# Patient Record
Sex: Female | Born: 1980 | Hispanic: Yes | Marital: Single | Smoking: Never smoker
Health system: Southern US, Community
[De-identification: ages and names within clinical notes are randomized; demographics above are authoritative.]

## PROBLEM LIST (undated history)

## (undated) ENCOUNTER — Inpatient Hospital Stay: Payer: Self-pay

## (undated) DIAGNOSIS — N7011 Chronic salpingitis: Secondary | ICD-10-CM

## (undated) DIAGNOSIS — D649 Anemia, unspecified: Secondary | ICD-10-CM

## (undated) HISTORY — DX: Chronic salpingitis: N70.11

---

## 2015-12-27 ENCOUNTER — Observation Stay
Admission: EM | Admit: 2015-12-27 | Discharge: 2015-12-28 | Disposition: A | Discharge status: Home / Self Care | Payer: Self-pay | Attending: Obstetrics and Gynecology | Admitting: Obstetrics and Gynecology

## 2015-12-27 ENCOUNTER — Encounter: Payer: Self-pay | Admitting: *Deleted

## 2015-12-27 DIAGNOSIS — O468X3 Other antepartum hemorrhage, third trimester: Secondary | ICD-10-CM | POA: Insufficient documentation

## 2015-12-27 DIAGNOSIS — Z3A29 29 weeks gestation of pregnancy: Secondary | ICD-10-CM | POA: Insufficient documentation

## 2015-12-27 NOTE — OB Triage Note (Signed)
Recvd pt from ED. C/o vaginal spotting/bleeding. Feeling baby move okay. Denies sexual intercourse in the past 24 hours. States she has no pain

## 2015-12-28 LAB — CHLAMYDIA/NGC RT PCR (ARMC ONLY)
Chlamydia Tr: NOT DETECTED
N gonorrhoeae: NOT DETECTED

## 2015-12-28 MED ORDER — BACITRACIN 500 UNIT/GM EX OINT
TOPICAL_OINTMENT | CUTANEOUS | Status: AC
  Administered 2015-12-28: 1 via TOPICAL
  Filled 2015-12-28: qty 0.9

## 2015-12-28 NOTE — Final Progress Note (Signed)
Physician Final Progress Note  Patient ID: Brittany Richards MRN: [id number] DOB/AGE: [birth year] 35 y.o.  Admit date: 12/27/2015 Admitting provider: Will Bonnet, MD Discharge date: 12/28/2015   Admission Diagnoses:  Vaginal bleeding  Discharge Diagnoses:  Vaginal laceration  History of Present Illness: The patient is a 35 y.o. female G2P1001 at [redacted]w[redacted]d who presents for new-onset vaginal bleeding. The bleeding started suddenly at 10pm last night.  No inciting factors. Nothing makes it better worse. She has tried no treatment. Denies smoking, drug use, abdominal trauma pelvic trauma.  Denies fevers, chills. Patient transferred her care to [facility] from [place] about two months ago.  She has had an ultrasound that showed a 24-week pregnancy.  Placenta is anterior with no reported previa (review of [facility] record where anatomy ultrasound was performed).   She is present with her husband a woman who claims to be a relative.   Past Medical History:  denies  Past Surgical History: Denies  Medications: None  Allergies: No Known Allergies  Social History: Denies smoking, EtOH use, drug use.  From [place].    Physical Exam: BP 133/80 (BP Location: Right Arm)   Pulse 100   Temp 98.1 F (36.7 C) (Oral)   Resp 16   LMP 06/04/2015   Gen: NAD CV: RRR Pulm: CTAB Abdomen: Gravid, non-tender, soft Pelvic: Approximately 3cm right labium majus laceration with active bleeding.. Edema involving the anterior introitus around the urethral meatus.  No other lesions noted. No active bleeding from cervix or found in the proximal vaginal. Cervical os is visually closed. The vaginal opening is cleared of blood. Pressure is held over the laceration and hemostasis obtained.  Bacitracin placed over laceration to protect from friction.  Ext: no e/c/t  Consults: None  Significant Findings/ Diagnostic Studies:  Gonorrhea/Chlamydia sent  Procedures:  NST  Baseline: 140 Variability:  moderate Accelerations: present Decelerations: absent Tocometry: no activity   Discharge Condition: stable  Disposition: Final discharge disposition not confirmed  Diet: Regular diet  Discharge Activity: Activity as tolerated, pelvic rest until at least her next appointment.  Total time spent taking care of this patient: 45 minutes  Signed: Will Bonnet, MD  12/28/2015, 1:00 AM

## 2015-12-28 NOTE — Discharge Instructions (Signed)
Wipe carefully/ pat dry Avoid tight clothing No sexual intercourse until after next OB appointment  If it starts to bleed, hold pressure at site.  Call OB with any questions or concerns!

## 2016-02-28 DIAGNOSIS — O133 Gestational [pregnancy-induced] hypertension without significant proteinuria, third trimester: Secondary | ICD-10-CM | POA: Insufficient documentation

## 2016-10-31 ENCOUNTER — Encounter (HOSPITAL_COMMUNITY): Payer: Self-pay

## 2018-06-05 DIAGNOSIS — O09522 Supervision of elderly multigravida, second trimester: Secondary | ICD-10-CM | POA: Insufficient documentation

## 2018-06-08 ENCOUNTER — Other Ambulatory Visit: Payer: Self-pay | Admitting: Family Medicine

## 2018-06-08 ENCOUNTER — Ambulatory Visit
Admission: RE | Admit: 2018-06-08 | Discharge: 2018-06-08 | Disposition: A | Discharge status: Home / Self Care | Payer: PRIVATE HEALTH INSURANCE | Source: Ambulatory Visit | Attending: Family Medicine | Admitting: Family Medicine

## 2018-06-08 ENCOUNTER — Ambulatory Visit
Admission: RE | Admit: 2018-06-08 | Discharge: 2018-06-08 | Disposition: A | Discharge status: Home / Self Care | Payer: PRIVATE HEALTH INSURANCE | Attending: Family Medicine | Admitting: Family Medicine

## 2018-06-08 DIAGNOSIS — R7611 Nonspecific reaction to tuberculin skin test without active tuberculosis: Secondary | ICD-10-CM | POA: Insufficient documentation

## 2019-01-17 ENCOUNTER — Ambulatory Visit: Payer: Self-pay | Admitting: Advanced Practice Midwife

## 2019-01-17 ENCOUNTER — Other Ambulatory Visit: Payer: Self-pay

## 2019-01-17 DIAGNOSIS — Z3009 Encounter for other general counseling and advice on contraception: Secondary | ICD-10-CM

## 2019-01-17 LAB — PREGNANCY, URINE: Preg Test, Ur: NEGATIVE

## 2019-01-17 LAB — HEMOGLOBIN, FINGERSTICK: Hemoglobin: 13.3 g/dL (ref 11.1–15.9)

## 2019-01-17 NOTE — Progress Notes (Signed)
In for post partum visit; unsure of [facility] Debera Lat, RN

## 2019-01-17 NOTE — Progress Notes (Signed)
Post Partum Exam  Brittany Richards is a 38 y.o. [phone] female who presents for a postpartum visit. She is 6 wks postpartum following a spontaneous vaginal delivery on 11/16/2018 for female infant. I have fully reviewed the prenatal and intrapartum course. The delivery was at 38.0 gestational weeks.  Anesthesia: epidural. Postpartum course has been normal. Baby's course has been good. Baby is feeding by breast q 2 hrs.. Bleeding no bleeding. Bowel function is normal. Bladder function is normal. Patient is sexually active. Coitus x2 (01/05/19 and 01/14/19 with condoms) Contraception method is condoms. Pt does not want another pregnancy  Postpartum depression screening: Edinburgh Postnatal Depression Scale - 01/17/19 1229      Edinburgh Postnatal Depression Scale:  In the Past 7 Days   I have been able to laugh and see the funny side of things.  0    I have looked forward with enjoyment to things.  0    I have blamed myself unnecessarily when things went wrong.  0    I have been anxious or worried for no good reason.  0    I have felt scared or panicky for no good reason.  0    Things have been getting on top of me.  0    I have been so unhappy that I have had difficulty sleeping.  0    I have felt sad or miserable.  0    I have been so unhappy that I have been crying.  0    The thought of harming myself has occurred to me.  0    Edinburgh Postnatal Depression Scale Total  0         Last pap smear  up to date  Review of Systems neg   Objective:  BP 134/86   Ht 5' 2.5" (1.588 m)   Wt 152 lb 9.6 oz (69.2 kg)   Breastfeeding Yes Comment: No menses since delivery  BMI 27.47 kg/m   General:  alert   Breasts:  inspection negative, no nipple discharge or bleeding, no masses or nodularity palpable  Lungs: clear to auscultation bilaterally  Heart:  regular rate and rhythm, S1, S2 normal, no murmur, click, rub or gallop  Abdomen: soft, non-tender; bowel sounds normal; no masses,  no organomegaly    Vulva:  normal  Vagina: normal vagina  Cervix:  wnl  Corpus: normal size, contour, position, consistency, mobility, non-tender  Adnexa:  normal adnexa  Rectal Exam: Not performed.        Assessment:     postpartum exam. Pap smear up to date  Plan:   1. Contraception: condoms but desires Mirena 2. Infant feeding:  patient is currently feeding with breastmilk.   3. Mood: EPDS is low risk. Reviewed resources and that mood sx in first year after pregnancy are considered related to pregnancy and to reach out for help at [facility] if needed. Discussed [facility] as link to care and availability of LCSW for counseling    Patient given handout about PCP care in the community Given MVI per family planning program  Follow up in: 1 wk for Mirena insertion or as needed.

## 2019-01-23 ENCOUNTER — Ambulatory Visit (LOCAL_COMMUNITY_HEALTH_CENTER): Payer: Self-pay | Admitting: Family Medicine

## 2019-01-23 ENCOUNTER — Other Ambulatory Visit: Payer: Self-pay

## 2019-01-23 VITALS — BP 132/90 | Wt 150.2 lb

## 2019-01-23 DIAGNOSIS — Z30430 Encounter for insertion of intrauterine contraceptive device: Secondary | ICD-10-CM

## 2019-01-23 DIAGNOSIS — Z113 Encounter for screening for infections with a predominantly sexual mode of transmission: Secondary | ICD-10-CM

## 2019-01-23 DIAGNOSIS — Z3009 Encounter for other general counseling and advice on contraception: Secondary | ICD-10-CM

## 2019-01-23 MED ORDER — LEVONORGESTREL 20 MCG/24HR IU IUD
1.0000 | INTRAUTERINE_SYSTEM | Freq: Once | INTRAUTERINE | Status: AC
  Administered 2019-01-23: 1 via INTRAUTERINE

## 2019-01-23 NOTE — Progress Notes (Signed)
    Patient presented to [facility] for IUD insertion. Her GC/CT screening was found to be up to date and using WHO criteria we can be reasonably certain she is not pregnant.  See Flowsheet for IUD check list  IUD Insertion Procedure Note Patient identified, informed consent performed, consent signed.   Discussed risks of irregular bleeding, cramping, infection, malpositioning or misplacement of the IUD outside the uterus which may require further procedure such as laparoscopy. Time out was performed.    Speculum placed in the vagina.  Cervix visualized.  Cleaned with Betadine x 2.  Grasped anteriorly with a single tooth tenaculum.  Uterus sounded to 11 cm.  IUD placed per manufacturer's recommendations.  Strings trimmed to 3 cm.  Tenaculum was removed, good hemostasis noted.  Patient tolerated procedure well.   Patient was given post-procedure instructions.  She was advised to have backup contraception for one week.  Patient was also asked to check IUD strings periodically or follow up in 4 weeks for IUD check.                                                    Co to take Ibuprofen 600-800 mg q 8 hrs for cramping prn.

## 2019-01-23 NOTE — Progress Notes (Signed)
Patient here for Mirena IUD insertion.Jenetta Downer, RN

## 2019-02-13 NOTE — Addendum Note (Signed)
Addended by: Caryl Bis on: 02/13/2019 02:58 PM   Modules accepted: Orders

## 2019-02-14 NOTE — Addendum Note (Signed)
Addended by: Hassell Done on: 02/14/2019 08:17 AM   Modules accepted: Orders

## 2019-02-22 NOTE — Addendum Note (Signed)
Addended by: Caryl Bis on: 02/22/2019 04:41 PM   Modules accepted: Orders

## 2019-02-27 NOTE — Addendum Note (Signed)
Addended by: Cletis Media on: 02/27/2019 02:31 PM   Modules accepted: Orders

## 2019-03-19 ENCOUNTER — Encounter: Payer: Self-pay | Admitting: Physician Assistant

## 2019-03-19 ENCOUNTER — Ambulatory Visit (LOCAL_COMMUNITY_HEALTH_CENTER): Payer: Self-pay | Admitting: Physician Assistant

## 2019-03-19 ENCOUNTER — Other Ambulatory Visit: Payer: Self-pay

## 2019-03-19 VITALS — BP 134/80 | Ht 63.0 in | Wt 149.0 lb

## 2019-03-19 DIAGNOSIS — Z3009 Encounter for other general counseling and advice on contraception: Secondary | ICD-10-CM

## 2019-03-19 DIAGNOSIS — Z30431 Encounter for routine checking of intrauterine contraceptive device: Secondary | ICD-10-CM

## 2019-03-19 NOTE — Progress Notes (Signed)
   [place] problem visit  [place] Department  Subjective:  Yarieliz Patzner is a 38 y.o. being seen today for irregular bleeding with the Mirena.  Chief Complaint  Patient presents with  . Contraception    HPI  Patient here due to bleeding since IUD placed on 01/23/2019.  Per chart, patient had PP exam 01/17/2019 and Mirena insertion on 01/23/2019.  States that she is still breastfeeding.  Reports that the bleeding has mostly been like a normal period as far as flow but seems to be slowing a little the last day or two.  Denies any vaginal symptoms and declines pelvic exam today.   Does the patient have a current or past history of drug use? No   No components found for: HCV]   Health Maintenance Due  Topic Date Due  . HIV Screening  10/26/1993  . PAP SMEAR-Modifier  10/27/1999  . INFLUENZA VACCINE  12/23/2018    Review of Systems  All other systems reviewed and are negative.   The following portions of the patient's history were reviewed and updated as appropriate: allergies, current medications, past family history, past medical history, past social history, past surgical history and problem list. Problem list updated.   See flowsheet for other program required questions.  Objective:   Vitals:   03/19/19 1033  BP: 134/80  Weight: 149 lb (67.6 kg)  Height: 5\' 3"  (1.6 m)    Physical Exam Vitals signs reviewed.  Constitutional:      General: She is not in acute distress.    Appearance: Normal appearance.  HENT:     Head: Normocephalic and atraumatic.  Pulmonary:     Effort: Pulmonary effort is normal.  Neurological:     Mental Status: She is alert and oriented to person, place, and time.  Psychiatric:        Mood and Affect: Mood normal.        Behavior: Behavior normal.        Thought Content: Thought content normal.        Judgment: Judgment normal.       Assessment and Plan:  Leonila Emmi is a 38 y.o. female presenting to  the [facility] Department for a Women's Health problem visit  Patient with current LARC device complaining of irregular bleeding. Has a Mirena. Counseled on options which include watchful waiting, scheduled NSAIDs, OCP for limited time, removal. Counseled on the the normality of bleeding with method and typical bleeding patterns with LARC method.   1. Encounter for counseling regarding contraception Reassured patient that she is having a normal SE of the Mirena and due to breastfeeding would not recommend hormonal option as first line for her. Reassured that if bleeding is slowing down it may stop on its own, but patient would like to "do" something other than wait to see if this happens. Counseled to use OTC IB 800 mg q 8 hr with food or milk for 5 days in a row and call back if bleeding gets heavier or resumes.  2. Surveillance of previously prescribed intrauterine contraceptive device Patient with MIrena and desires to continue with this as BCM. Declines pelvic today due to bleeding.     No follow-ups on file.  No future appointments.  Jerene Dilling, PA

## 2019-03-19 NOTE — Progress Notes (Signed)
In due to c/o bleeding since IUD insertion 01/23/19; reports reddish and now turning brownish;  Satisfied with IUD otherwise Debera Lat, RN

## 2019-04-06 ENCOUNTER — Telehealth: Payer: Self-pay | Admitting: Family Medicine

## 2019-04-06 NOTE — Telephone Encounter (Signed)
TC to patient who states she has had continual bleeding since Mirena insertion on 01/23/2019. Patient came to clinic on 03/19/2019 due to bleeding, and states she tried the recommended ibuprofen 3 times per day for 5 days and that it did not help. Patient counseled that replacing the IUD with a nexplanon would not likely solve her problem because Nexplanon has same type hormone as Mirena and also has side effect of irregular bleeding for 3-6 months after insertion. After consult with provider, C. Lazarus Salines, patient was called again with provider recommendation that she keep her 04/10/2019 appointment. Patient counseled that provider recommends trying a couple of months of OC with IUD to get bleeding under control, rather than switching to Nexplanon at this time. Patient states understanding and agrees to plan. Interpreter, Dot Lanes.Jenetta Downer, RN

## 2019-04-06 NOTE — Telephone Encounter (Signed)
Consulted by RN re:  Patient complaint of irregular bleeding with IUD.  Reviewed documentation and agree with advice given as documented.

## 2019-04-10 ENCOUNTER — Other Ambulatory Visit: Payer: Self-pay

## 2019-04-10 ENCOUNTER — Encounter: Payer: Self-pay | Admitting: Family Medicine

## 2019-04-10 ENCOUNTER — Ambulatory Visit (LOCAL_COMMUNITY_HEALTH_CENTER): Payer: Self-pay | Admitting: Family Medicine

## 2019-04-10 VITALS — BP 127/82 | Ht 62.0 in | Wt 147.0 lb

## 2019-04-10 DIAGNOSIS — Z975 Presence of (intrauterine) contraceptive device: Secondary | ICD-10-CM

## 2019-04-10 DIAGNOSIS — Z3009 Encounter for other general counseling and advice on contraception: Secondary | ICD-10-CM

## 2019-04-10 DIAGNOSIS — N921 Excessive and frequent menstruation with irregular cycle: Secondary | ICD-10-CM

## 2019-04-10 DIAGNOSIS — Z30011 Encounter for initial prescription of contraceptive pills: Secondary | ICD-10-CM

## 2019-04-10 MED ORDER — NORGESTIMATE-ETH ESTRADIOL 0.25-35 MG-MCG PO TABS: 1.0000 | ORAL_TABLET | Freq: Every day | ORAL | 11 refills | Status: DC

## 2019-04-10 MED ORDER — NORGESTIMATE-ETH ESTRADIOL 0.25-35 MG-MCG PO TABS: 1.0000 | ORAL_TABLET | Freq: Every day | ORAL | 0 refills | Status: DC

## 2019-04-10 NOTE — Progress Notes (Signed)
   [place] problem visit  [place] Department  Subjective:  Brittany Richards is a 38 y.o. being seen today for   Chief Complaint  Patient presents with  . Contraception  . Menstrual Problem    HPI  Client states that she has had irreg bleeding with Mirena since insertion 01/2019.  Mirena was placed ~ 6 wks pp @ [facility].  Client states she was instructed to take ibuprofen 600 mg TID.  States she took med 5 days and felt it wasn't helping her bleeding.  She states she had an IUD in the past for about 1 year and had removed d/t irreg. Bleeding.  She feels that a switch to the implant will help her bleeding.  Denies abdominal pain, vaginal disch,pain with sex,or fever.   Does the patient have a current or past history of drug use? No   No components found for: HCV]   Health Maintenance Due  Topic Date Due  . HIV Screening  10/26/1993  . PAP SMEAR-Modifier  10/27/1999  . INFLUENZA VACCINE  12/23/2018    ROS  The following portions of the patient's history were reviewed and updated as appropriate: allergies, current medications, past family history, past medical history, past social history, past surgical history and problem list. Problem list updated.   See flowsheet for other program required questions.  Objective:   Vitals:   04/10/19 1053  BP: 127/82  Weight: 147 lb (66.7 kg)  Height: 5\' 2"  (1.575 m)    Physical Exam  not indicated    Assessment and Plan:  Brittany Richards is a 38 y.o. female presenting to the [facility] Department for a Women's Health problem visit  Patient with current LARC device complaining of irregular bleeding. Has a Mirena. Counseled on options which include watchful waiting, scheduled NSAIDs, OCP for limited time, removal. Counseled on the the normality of bleeding with method and typical bleeding patterns with LARC method.   1. General counseling and advice on contraceptive management  2. Breakthrough  bleeding associated with intrauterine device (IUD) Co that irregular bleeding is a normal SE of the IUD.  Co that changing to another Progesterone method (Nexplanon) may have the same SE for client.  Advised to manage the bleeding with the IUD instead of changing a method.  Client agrees with the plan. - norgestimate-ethinyl estradiol (ORTHO-CYCLEN) 0.25-35 MG-MCG tablet; Take 1 tablet by mouth daily.  Dispense: 2 Package; Refill: 0 Co. To take 6 weeks of active pills.  Co. To contact clinic if bleeding hasn't resolved.  3. OCP (oral contraceptive pills) initiatio - norgestimate-ethinyl estradiol (ORTHO-CYCLEN) 0.25-35 MG-MCG tablet; Take 1 tablet by mouth daily.  Dispense: 2 Package; Refill: 0     No follow-ups on file.  No future appointments.  Hassell Done, FNP

## 2019-04-10 NOTE — Progress Notes (Signed)
Patient here to have IUD removed and change birth control methods. See provider note. Hal Morales, RN

## 2019-10-13 ENCOUNTER — Emergency Department
Admission: EM | Admit: 2019-10-13 | Discharge: 2019-10-13 | Disposition: A | Discharge status: Home / Self Care | Payer: Self-pay | Attending: Emergency Medicine | Admitting: Emergency Medicine

## 2019-10-13 ENCOUNTER — Other Ambulatory Visit: Payer: Self-pay

## 2019-10-13 ENCOUNTER — Encounter: Payer: Self-pay | Admitting: Emergency Medicine

## 2019-10-13 ENCOUNTER — Emergency Department: Payer: Self-pay

## 2019-10-13 DIAGNOSIS — M5432 Sciatica, left side: Secondary | ICD-10-CM | POA: Insufficient documentation

## 2019-10-13 DIAGNOSIS — R1900 Intra-abdominal and pelvic swelling, mass and lump, unspecified site: Secondary | ICD-10-CM

## 2019-10-13 DIAGNOSIS — R1909 Other intra-abdominal and pelvic swelling, mass and lump: Secondary | ICD-10-CM | POA: Insufficient documentation

## 2019-10-13 LAB — COMPREHENSIVE METABOLIC PANEL
ALT: 15 U/L (ref 0–44)
AST: 17 U/L (ref 15–41)
Albumin: 4.8 g/dL (ref 3.5–5.0)
Alkaline Phosphatase: 90 U/L (ref 38–126)
Anion gap: 11 (ref 5–15)
BUN: 12 mg/dL (ref 6–20)
CO2: 22 mmol/L (ref 22–32)
Calcium: 9 mg/dL (ref 8.9–10.3)
Chloride: 103 mmol/L (ref 98–111)
Creatinine, Ser: 0.44 mg/dL (ref 0.44–1.00)
GFR calc Af Amer: >60 mL/min (ref >60)
GFR calc non Af Amer: >60 mL/min (ref >60)
Glucose, Bld: 92 mg/dL (ref 70–99)
Potassium: 3.5 mmol/L (ref 3.5–5.1)
Sodium: 136 mmol/L (ref 135–145)
Total Bilirubin: 0.7 mg/dL (ref 0.3–1.2)
Total Protein: 8.7 g/dL — ABNORMAL HIGH (ref 6.5–8.1)

## 2019-10-13 LAB — URINALYSIS, COMPLETE (UACMP) WITH MICROSCOPIC
Bacteria, UA: NONE SEEN
Bilirubin Urine: NEGATIVE
Glucose, UA: NEGATIVE mg/dL
Hgb urine dipstick: NEGATIVE
Ketones, ur: NEGATIVE mg/dL
Leukocytes,Ua: NEGATIVE
Nitrite: NEGATIVE
Protein, ur: NEGATIVE mg/dL
Specific Gravity, Urine: 1.008 (ref 1.005–1.030)
Squamous Epithelial / LPF: NONE SEEN (ref 0–5)
pH: 5 (ref 5.0–8.0)

## 2019-10-13 LAB — CBC WITH DIFFERENTIAL/PLATELET
Abs Immature Granulocytes: 0.03 10*3/uL (ref 0.00–0.07)
Basophils Absolute: 0 10*3/uL (ref 0.0–0.1)
Basophils Relative: 0 %
Eosinophils Absolute: 0.1 10*3/uL (ref 0.0–0.5)
Eosinophils Relative: 1 %
HCT: 37.2 % (ref 36.0–46.0)
Hemoglobin: 12.8 g/dL (ref 12.0–15.0)
Immature Granulocytes: 0 %
Lymphocytes Relative: 25 %
Lymphs Abs: 2.6 10*3/uL (ref 0.7–4.0)
MCH: 31 pg (ref 26.0–34.0)
MCHC: 34.4 g/dL (ref 30.0–36.0)
MCV: 90.1 fL (ref 80.0–100.0)
Monocytes Absolute: 0.8 10*3/uL (ref 0.1–1.0)
Monocytes Relative: 7 %
Neutro Abs: 6.9 10*3/uL (ref 1.7–7.7)
Neutrophils Relative %: 67 %
Platelets: 360 10*3/uL (ref 150–400)
RBC: 4.13 MIL/uL (ref 3.87–5.11)
RDW: 11.9 % (ref 11.5–15.5)
WBC: 10.4 10*3/uL (ref 4.0–10.5)
nRBC: 0 % (ref 0.0–0.2)

## 2019-10-13 LAB — POCT PREGNANCY, URINE: Preg Test, Ur: NEGATIVE

## 2019-10-13 MED ORDER — PREDNISONE 10 MG PO TABS
ORAL_TABLET | ORAL | 0 refills | Status: DC
Start: 2019-10-13 — End: 2019-10-24

## 2019-10-13 MED ORDER — IOHEXOL 300 MG/ML  SOLN
100.0000 mL | Freq: Once | INTRAMUSCULAR | Status: AC | PRN
  Administered 2019-10-13: 100 mL via INTRAVENOUS
  Filled 2019-10-13: qty 100

## 2019-10-13 NOTE — ED Triage Notes (Signed)
Pt checked in at first nurse desk.  Verbal permission given from patient to ask questions at first nurse desk for triage.    Pt c/o lower left back pain for 2 weeks. No known injury.  No abdominal pain.  When sitting pain worse. Also worse with movement. Only time no pain is when standing still.  Goes down left leg.

## 2019-10-13 NOTE — ED Triage Notes (Signed)
First nurse note- lower back pain. No fall.

## 2019-10-13 NOTE — ED Provider Notes (Signed)
[facility] Emergency Department Provider Note  ____________________________________________   First MD Initiated Contact with Patient 10/13/19 1415     (approximate)  I have reviewed the triage vital signs and the nursing notes.   HISTORY  Chief Complaint Back Pain   HPI Brittany Richards is a 39 y.o. female presents to the ED with complaint of low back pain with radiation down her left leg that started approximately 2 weeks ago.  Patient also complains of some left sided abdominal pain.  Patient reports that she has had a "lump" that she can feel in her abdomen since her child was born 58 months ago.  Patient is currently breast-feeding her 24-month-old child.      History reviewed. No pertinent past medical history.  There are no problems to display for this patient.   History reviewed. No pertinent surgical history.  Prior to Admission medications   Medication Sig Start Date End Date Taking? Authorizing Provider  norgestimate-ethinyl estradiol (ORTHO-CYCLEN) 0.25-35 MG-MCG tablet Take 1 tablet by mouth daily. 04/10/19   Hassell Done, FNP  predniSONE (DELTASONE) 10 MG tablet Take 3 tablets once a day for 4 days 10/13/19   Johnn Hai, PA-C    Allergies Patient has no known allergies.  History reviewed. No pertinent family history.  Social History Social History   Tobacco Use  . Smoking status: Never Smoker  . Smokeless tobacco: Never Used  Substance Use Topics  . Alcohol use: No  . Drug use: No    Review of Systems Constitutional: No fever/chills Eyes: No visual changes. ENT: No sore throat. Cardiovascular: Denies chest pain. Respiratory: Denies shortness of breath. Gastrointestinal: Left anterior abdominal pain.  No nausea, no vomiting.  No diarrhea.  Genitourinary: Negative for dysuria. Musculoskeletal: Positive for pain left hip and upper thigh. Skin: Negative for rash. Neurological: Negative for headaches, focal weakness  or numbness. ___________________________________________   PHYSICAL EXAM:  VITAL SIGNS: ED Triage Vitals  Enc Vitals Group     BP 10/13/19 1358 (!) 154/85     Pulse Rate 10/13/19 1358 (!) 106     Resp 10/13/19 1358 16     Temp 10/13/19 1358 98.3 F (36.8 C)     Temp Source 10/13/19 1358 Oral     SpO2 10/13/19 1358 98 %     Weight 10/13/19 1356 136 lb (61.7 kg)     Height --      Head Circumference --      Peak Flow --      Pain Score 10/13/19 1355 6     Pain Loc --      Pain Edu? --      Excl. in [place]? --     Constitutional: Alert and oriented. Well appearing and in no acute distress. Eyes: Conjunctivae are normal.  Head: Atraumatic. Neck: No stridor.   Cardiovascular: Normal rate, regular rhythm. Grossly normal heart sounds.  Good peripheral circulation. Respiratory: Normal respiratory effort.  No retractions. Lungs CTAB. Gastrointestinal: Soft and nontender. No distention.  Left anterior mid abdomen there is a oval-shaped mass that is mobile.  Nontender to palpation.  No skin discoloration is noted in this area. Musculoskeletal: Moves upper and lower extremities with out any difficulty.  Lumbar spine is negative for any acute bony tenderness and no step-offs were appreciated.  There is marked tenderness on palpation of the left SI joint which is not present on the right side.  Patient in this area reproduces patient's pain.  Range of motion  is unrestricted.  Good muscle strength bilaterally.  Patient is able to stand without any assistance.  Normal gait was noted.. Neurologic:  Normal speech and language. No gross focal neurologic deficits are appreciated.  Reflexes are equal bilaterally at 2+.  No gait instability. Skin:  Skin is warm, dry and intact. No rash noted. Psychiatric: Mood and affect are normal. Speech and behavior are normal.  ____________________________________________   LABS (all labs ordered are listed, but only abnormal results are displayed)  Labs  Reviewed  URINALYSIS, COMPLETE (UACMP) WITH MICROSCOPIC - Abnormal; Notable for the following components:      Result Value   Color, Urine STRAW (*)    APPearance CLEAR (*)    All other components within normal limits  COMPREHENSIVE METABOLIC PANEL - Abnormal; Notable for the following components:   Total Protein 8.7 (*)    All other components within normal limits  CBC WITH DIFFERENTIAL/PLATELET  POC URINE PREG, ED  POCT PREGNANCY, URINE    RADIOLOGY    Official radiology report(s): CT ABDOMEN W CONTRAST  Result Date: 10/13/2019 CLINICAL DATA:  Abdominal wall mass, palpable abnormality, seen by ultrasound EXAM: CT ABDOMEN WITH CONTRAST TECHNIQUE: Multidetector CT imaging of the abdomen was performed using the standard protocol following bolus administration of intravenous contrast. CONTRAST:  196mL OMNIPAQUE IOHEXOL 300 MG/ML  SOLN COMPARISON:  Same-day ultrasound FINDINGS: Lower chest: No acute abnormality. Hepatobiliary: No solid liver abnormality is seen. Small gallstone in the dependent gallbladder. Gallbladder wall thickening, or biliary dilatation. Pancreas: Unremarkable. No pancreatic ductal dilatation or surrounding inflammatory changes. Spleen: Normal in size without significant abnormality. Adrenals/Urinary Tract: Adrenal glands are unremarkable. Kidneys are normal, without renal calculi, solid lesion, or hydronephrosis. Bladder is unremarkable. Stomach/Bowel: Stomach is within normal limits. Appendix appears normal. No evidence of bowel wall thickening, distention, or inflammatory changes. Vascular/Lymphatic: No significant vascular findings are present. No enlarged abdominal lymph nodes. Other: No abdominal wall hernia or abnormality. No abdominopelvic ascites. Musculoskeletal: There is an ill-defined, oval soft tissue mass in the left external oblique musculature measuring approximately 3.0 x 2.7 x 1.4 cm, in keeping with palpable abnormality and previously visualized by  ultrasound. IMPRESSION: 1. There is an ill-defined, oval soft tissue mass in the left external oblique musculature measuring approximately 3.0 x 2.7 x 1.4 cm, in keeping with palpable abnormality and previously visualized by ultrasound. This is nonspecific but concerning for soft tissue malignancy. Differential considerations do include benign sequelae of trauma. Nonemergent MRI may be helpful to further characterize, particularly for solid character and contrast enhancement. Consider tissue sampling and surgical referral. 2. Cholelithiasis. Electronically Signed   By: Eddie Candle M.D.   On: 10/13/2019 18:34   US Abdomen Limited  Result Date: 10/13/2019 CLINICAL DATA:  Palpable mass left mid abdomen EXAM: ULTRASOUND ABDOMEN LIMITED COMPARISON:  None. FINDINGS: Sonographic evaluation of the palpable area in the left mid abdominal wall was performed. Deep to the subcutaneous fat, there is a heterogeneous hypoechoic circumscribed 2.8 x 1.3 x 2.8 cm solid mass. This may involve the ventral aspect of the abdominal wall musculature. There is minimal internal vascularity. No evidence of underlying hernia. IMPRESSION: 1. Indeterminate circumscribed solid mass within the left mid abdominal wall corresponding to the palpable abnormality. Etiology, but differential diagnosis is nonspecific based on ultrasound characteristics could include intramuscular hematoma, intramuscular lipoma, or other soft tissue mass. CT of the abdomen with IV contrast may allow for better characterization. Electronically Signed   By: Randa Ngo M.D.   On: 10/13/2019 16:54  ____________________________________________   PROCEDURES  Procedure(s) performed (including Critical Care):  Procedures   ____________________________________________   INITIAL IMPRESSION / ASSESSMENT AND PLAN / ED COURSE  As part of my medical decision making, I reviewed the following data within the electronic MEDICAL RECORD NUMBER Notes from prior ED  visits and [place] Controlled Substance Database  39 year old female presents to the ED with complaint of left leg pain/numbness radiating from her left hip.  No history of injury.  She also is worried about a "lump" in her abdomen is been there for approximately 11 months.  Patient states she noticed it after giving birth to her child.  On physical exam her left leg paresthesias are most likely related to left sciatica and can be reproduced on palpation of the left SI joint area.  Neurologically she remains intact.  There is a oval-shaped, nontender, mobile mass in the left lateral portion of the abdomen.  Area is seen on both ultrasound and CT and has not been identified.  Differential diagnosis per radiologist includes possible soft tissue malignancy versus soft tissue trauma with sequela.  In talking with patient she does not have a PCP.  A referral is being made to the surgeon who is on-call today which is Dr. Dahlia Byes.  Patient and husband are aware that they need to call and make an appointment for further evaluation of this area.  Because she is breast-feeding patient was given a low dose of prednisone daily for the next 4 days.  We did discuss not using narcotics due to breast-feeding and patient understands.     ____________________________________________   FINAL CLINICAL IMPRESSION(S) / ED DIAGNOSES  Final diagnoses:  Abdominal mass of other site  Sciatica of left side     ED Discharge Orders         Ordered    predniSONE (DELTASONE) 10 MG tablet     10/13/19 1900           Note:  This document was prepared using Dragon voice recognition software and may include unintentional dictation errors.    Johnn Hai, PA-C 10/13/19 1913    Carrie Mew, MD 10/14/19 2211

## 2019-10-13 NOTE — ED Notes (Signed)
See triage note  Presents with lower back pain  Pain is mainly on left and goes into left leg

## 2019-10-13 NOTE — Discharge Instructions (Addendum)
You need to call and make an appointment with Dr. Dahlia Byes, who is the surgeon on call for further evaluation of the area that you are able to feel in your abdomen. It may be necessary to have surgery to remove this. For your sciatica a prescription for prednisone was sent to your pharmacy as needed for pain.

## 2019-10-15 ENCOUNTER — Telehealth: Payer: Self-pay | Admitting: Emergency Medicine

## 2019-10-15 NOTE — Telephone Encounter (Signed)
Pt was seen in ED 5/22, has abdominal mass. Per Dr Dahlia Byes pt needs to get schedule 1-2 weeks for follow up.  Called pt to make appt, no answer. Unable to leave vm due to vm not being set up. Pt does not have another contact number to call. Will call again at a later time.

## 2019-10-15 NOTE — Telephone Encounter (Signed)
Pt called back to the office. Appt made for 6/2 at 10:45am. Pt voiced understanding.

## 2019-10-24 ENCOUNTER — Ambulatory Visit (INDEPENDENT_AMBULATORY_CARE_PROVIDER_SITE_OTHER): Payer: Self-pay | Admitting: Surgery

## 2019-10-24 ENCOUNTER — Other Ambulatory Visit: Payer: Self-pay

## 2019-10-24 ENCOUNTER — Encounter: Payer: Self-pay | Admitting: Surgery

## 2019-10-24 VITALS — BP 152/93 | HR 118 | Temp 97.7°F | Resp 12 | Wt 136.4 lb

## 2019-10-24 DIAGNOSIS — R1902 Left upper quadrant abdominal swelling, mass and lump: Secondary | ICD-10-CM

## 2019-10-24 NOTE — Patient Instructions (Addendum)
MRI Abdomen is scheduled for 11/06/19 at [place] at the [facility]. Arrival time 8:30am. Nothing to eat or drink 4 hours prior.   Please see you appointment below. Contact the office if you have any questions or concerns.   La resonancia magntica del abdomen est programada para el 15/6/21 a las 9 am en [facility]. Hora de llegada 8:30 am. Hollice Gong para comer o beber 4 horas antes.  Por favor, vea su cita a continuacin. Comunquese con la oficina si tiene alguna pregunta o inquietud.

## 2019-10-25 ENCOUNTER — Encounter: Payer: Self-pay | Admitting: Surgery

## 2019-10-25 NOTE — Progress Notes (Signed)
Patient ID: Brittany Richards, female   DOB: Dec 16, 1980, 39 y.o.   MRN: [id number]  HPI Brittany Richards is a 39 y.o. female seen in consultation at the request of Ms. Summers PAC.  She was evaluated last week for a lump to the abdominal wall.  She reports that she has this for those to a year and noticed while she was pregnant.  She reports that there is no really significant change in size.  She denies any abdominal pain she denies any fevers any chills any B type symptoms.  She also denies any trauma.  She has had an ultrasound and CT scan that have personally reviewed showing evidence of a solid lesion within the left abdominal wall.  Concerning for malignancy.  Mass measures 3 x 3 cm. She denies any previous abdominal operations.  She is able to perform more than 4 METS of activity without any shortness of breath or chest pain. CBC and CMP were completely normal  HPI  History reviewed. No pertinent past medical history.  History reviewed. No pertinent surgical history.  History reviewed. No pertinent family history.  Social History Social History   Tobacco Use  . Smoking status: Never Smoker  . Smokeless tobacco: Never Used  Substance Use Topics  . Alcohol use: No  . Drug use: No    No Known Allergies  No current outpatient medications on file.   No current facility-administered medications for this visit.     Review of Systems Full ROS  was asked and was negative except for the information on the HPI  Physical Exam Blood pressure (!) 152/93, pulse (!) 118, temperature 97.7 F (36.5 C), resp. rate 12, weight 136 lb 6.4 oz (61.9 kg), SpO2 98 %, currently breastfeeding. CONSTITUTIONAL: NAD EYES: Pupils are equal, round, and reactive , Sclera are non-icteric. EARS, NOSE, MOUTH AND THROAT: She is wearing a mask. Hearing is intact to voice. LYMPH NODES:  Lymph nodes in the neck are normal. RESPIRATORY:  Lungs are clear. There is normal respiratory effort, with equal breath sounds  bilaterally, and without pathologic use of accessory muscles. CARDIOVASCULAR: Heart is regular without murmurs, gallops, or rubs. GI: The abdomen is soft, nontender, and nondistended. There is a palpable mass, located to the left of the abdominal wall.  This is between the left subcostal margin and the left ASIS.  It is within the abdominal wall and is solid.   There is no hepatosplenomegaly. There are normal bowel sounds in all quadrants. GU: Rectal deferred.   MUSCULOSKELETAL: Normal muscle strength and tone. No cyanosis or edema.   SKIN: Turgor is good and there are no pathologic skin lesions or ulcers. NEUROLOGIC: Motor and sensation is grossly normal. Cranial nerves are grossly intact. PSYCH:  Oriented to person, place and time. Affect is normal.  Data Reviewed I have personally reviewed the patient's imaging, laboratory findings and medical records.    Assessment/Plan 39 year old female with a left solid abdominal wall mass.  I am concerned for sarcoma.  There is no evidence of trauma or other surgical history.  I had an extensive discussion with the patient regarding possible diagnosis.  I do recommend further imaging in the form of MRI to assess for liposarcoma versus rhabdomyosarcoma versus other etiologies.  If we found out that this is a sarcoma more than likely will have to refer her out to surgical oncology so they can do R0 resection with reconstruction.  We will first have to establish a diagnosis. Time spent with the patient  was 60 minutes, with more than 50% of the time spent in face-to-face education, counseling and care coordination.   A copy of this report was sent to the referring provider  Caroleen Hamman, MD FACS General Surgeon 10/25/2019, 10:00 AM

## 2019-10-29 ENCOUNTER — Encounter: Payer: Self-pay | Admitting: Surgery

## 2019-10-29 ENCOUNTER — Other Ambulatory Visit: Payer: Self-pay

## 2019-10-29 ENCOUNTER — Ambulatory Visit (INDEPENDENT_AMBULATORY_CARE_PROVIDER_SITE_OTHER): Payer: Self-pay | Admitting: Surgery

## 2019-10-29 VITALS — BP 146/94 | HR 92 | Temp 98.1°F | Resp 12 | Ht 62.0 in | Wt 136.0 lb

## 2019-10-29 DIAGNOSIS — R1902 Left upper quadrant abdominal swelling, mass and lump: Secondary | ICD-10-CM

## 2019-10-29 NOTE — Patient Instructions (Addendum)
Keep your appointment for your MRI on 11/06/19 at 9am. Nothing to eat or drink 4 hours prior.   Mantenga su cita para su resonancia magntica el 15/6/21 a las [place] o beber 4 horas antes.  Please see your appointment below, call the office if you have any questions or concerns.   Consulte su cita a continuacin, llame a la oficina si tiene alguna pregunta o inquietud.

## 2019-10-30 ENCOUNTER — Encounter: Payer: Self-pay | Admitting: Surgery

## 2019-10-30 NOTE — Progress Notes (Signed)
Brittany Richards  is a 39 year old female well-known to me with a history of left abdominal wall mass concerning for soft tissue sarcoma.  She dates she has her sister-in-law and also her husband present.  I again explained to them about the situation in detail.  Please note that this visit was primarily for counseling purposes as the patient was in shock the last time I saw her.  I explained to them that I am very concerned about a sarcoma of the abdominal wall and that we will require MRI to further correct arise lesion.  I also discussed with them if in fact this is a potential sarcoma that I will have to refer her for a center of excellence with spur T's in sarcomas of the soft tissue.  Pt Wishes to be referred to [facility] if possible. Spent over 45 minutes in this encounter with greater than 50% spent in coordination and counseling of her care.  I showed the patient and the family again the images available and had an extensive discussion with the patient.

## 2019-11-06 ENCOUNTER — Other Ambulatory Visit: Payer: Self-pay

## 2019-11-06 ENCOUNTER — Ambulatory Visit
Admission: RE | Admit: 2019-11-06 | Discharge: 2019-11-06 | Disposition: A | Discharge status: Home / Self Care | Payer: Self-pay | Source: Ambulatory Visit | Attending: Surgery | Admitting: Surgery

## 2019-11-06 DIAGNOSIS — R1902 Left upper quadrant abdominal swelling, mass and lump: Secondary | ICD-10-CM | POA: Insufficient documentation

## 2019-11-06 MED ORDER — GADOBUTROL 1 MMOL/ML IV SOLN
6.0000 mL | Freq: Once | INTRAVENOUS | Status: AC | PRN
  Administered 2019-11-06: 6 mL via INTRAVENOUS

## 2019-11-07 ENCOUNTER — Ambulatory Visit (INDEPENDENT_AMBULATORY_CARE_PROVIDER_SITE_OTHER): Payer: Self-pay | Admitting: Surgery

## 2019-11-07 ENCOUNTER — Telehealth: Payer: Self-pay | Admitting: Surgery

## 2019-11-07 ENCOUNTER — Encounter: Payer: Self-pay | Admitting: Surgery

## 2019-11-07 VITALS — BP 130/84 | HR 111 | Temp 97.5°F | Ht 61.0 in | Wt 132.0 lb

## 2019-11-07 DIAGNOSIS — D481 Neoplasm of uncertain behavior of connective and other soft tissue: Secondary | ICD-10-CM

## 2019-11-07 NOTE — Telephone Encounter (Signed)
Outgoing call is made from [place] with [facility] to inform patient of the following:   Pt has been advised of Pre-Admission date/time, COVID Testing date and Surgery date.  Surgery Date: 11/13/19 Preadmission Testing Date: 11/09/19 (phone 8a-1p) Covid Testing Date: 11/12/19 - patient advised to go to the [place] ([place]) between 8a-1p  Patient has been made aware to call [phone], between 1-3:00pm the day beforesurgery, to find out what time to arrive for surgery.

## 2019-11-07 NOTE — Patient Instructions (Addendum)
Our surgery scheduler will contact you to schedule your surgery. Please have the pink sheet available when she calls you.   Please call the office if you have any questions or concerns.  Nuestro programador de Event organiser se Cytogeneticist con usted para Risk manager su Leisure centre manager. Tenga la hoja rosado disponible cuando ella lo llame.  Llame a la oficina si tiene alguna pregunta o inquietud.

## 2019-11-08 ENCOUNTER — Encounter: Payer: Self-pay | Admitting: Surgery

## 2019-11-08 NOTE — H&P (View-Only) (Signed)
Outpatient Surgical Follow Up  11/08/2019  Brittany Richards is an 39 y.o. female.   Chief Complaint  Patient presents with  . Follow-up     Follow Up: Abdominal Mass- discuss MRI 11/06/19    HPI: Brittany Richards is a 39 year old female well-known to me with a history of a left abdominal wall mass.  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  It does not seem to be involving any deeper structures and is confined to the abdominal wall musculature specifically to the left external oblique muscle. No evidence of distant metastatic disease.  History reviewed. No pertinent past medical history.  History reviewed. No pertinent surgical history.  History reviewed. No pertinent family history.  Social History:  reports that she has never smoked. She has never used smokeless tobacco. She reports that she does not drink alcohol and does not use drugs.  Allergies: No Known Allergies  Medications reviewed.    ROS Full ROS performed and is otherwise negative other than what is stated in HPI   BP 130/84   Pulse (!) 111   Temp (!) 97.5 F (36.4 C) (Temporal)   Ht 5\' 1"  (1.549 m)   Wt 132 lb (59.9 kg)   SpO2 97%   BMI 24.94 kg/m   Physical Exam CONSTITUTIONAL: NAD EYES: Pupils are equal, round, and reactive , Sclera are non-icteric. EARS, NOSE, MOUTH AND THROAT: She is wearing a mask. Hearing is intact to voice. LYMPH NODES:  Lymph nodes in the neck are normal. RESPIRATORY:  Lungs are clear. There is normal respiratory effort, with equal breath sounds bilaterally, and without pathologic use of accessory muscles. CARDIOVASCULAR: Heart is regular without murmurs, gallops, or rubs. GI: The abdomen is soft, nontender, and nondistended. There is a palpable mass, located to the left of the abdominal wall.  This is between the left subcostal margin and the left ASIS.  It is within the abdominal wall and is solid.   I am able to move it and does not seem to be fixed to any bony or deep structures.  There is no hepatosplenomegaly. There are normal bowel sounds in all quadrants. GU: Rectal deferred.   MUSCULOSKELETAL: Normal muscle strength and tone. No cyanosis or edema.   SKIN: Turgor is good and there are no pathologic skin lesions or ulcers. NEUROLOGIC: Motor and sensation is grossly normal. Cranial nerves are grossly intact. PSYCH:  Oriented to person, place and time. Affect is normal.   No results found for this or any previous visit (from the past 48 hour(s)). MR Abdomen W Wo Contrast  Result Date: 11/06/2019 CLINICAL DATA:  Left anterior abdominal wall mass. EXAM: MRI ABDOMEN WITHOUT AND WITH CONTRAST TECHNIQUE: Multiplanar multisequence MR imaging of the abdomen was performed both before and after the administration of intravenous contrast. CONTRAST:  56mL GADAVIST GADOBUTROL 1 MMOL/ML IV SOLN COMPARISON:  Ultrasound and CT abdomen dated Oct 13, 2019. FINDINGS: Within the left external oblique muscle, there is an oval, well-defined predominantly T2 hyperintense, T1 isointense, heterogeneously enhancing mass measuring 2.5 x 1.5 x 4.4 cm, previously 2.7 x 1.4 x 4.5 cm on CT (by my measurements). Mass demonstrates few streaky areas of low T2 signal. There is no surrounding soft tissue edema. The visualized intra-abdominal contents are unremarkable. IMPRESSION: 1. 4.4 cm solid mass in the left external oblique muscle. Although the imaging appearance is nonspecific, this most likely represents a desmoid tumor given the  patient's age and gender. Confirmation with tissue sampling is recommended to exclude the unlikely possibility of a sarcoma. Electronically Signed   By: Titus Dubin M.D.   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  I had an extensive discussion with the patient regarding her disease process.  Given symptoms I definitely recommend excision.   Also discussed with her that this is different than a sarcoma but the only way to truly differentiated is after excision.  I do not want to perform a Tru-Cut biopsy as this will not change anything management and I do think that the pathologist will have better tissue higher yield with an excisional biopsy specimen.  I also discussed with her the importance of surveillance colonoscopy given the fact that this has been associated with FAP.  Currently patient wishes to address the abdominal wall mass first.  I do think that is reasonable to proceed with excision of abdominal wall mass.  Procedure discussed with the patient in detail.  Risks, benefits and possible applications including but not limited to: Bleeding, infection, significant chance of recurrence, abdominal wall hernias and low probability the pathology being upgraded to sarcoma.  Exensive counseling provided   Greater than 50% of the 40 minutes  visit was spent in counseling/coordination of care   Caroleen Hamman, MD [place] Surgeon

## 2019-11-08 NOTE — Progress Notes (Signed)
Outpatient Surgical Follow Up  11/08/2019  Brittany Richards is an 39 y.o. female.   Chief Complaint  Patient presents with   Follow-up     Follow Up: Abdominal Mass- discuss MRI 11/06/19    HPI: Brittany Richards is a 39 year old female well-known to me with a history of a left abdominal wall mass.  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  It does not seem to be involving any deeper structures and is confined to the abdominal wall musculature specifically to the left external oblique muscle. No evidence of distant metastatic disease.  History reviewed. No pertinent past medical history.  History reviewed. No pertinent surgical history.  History reviewed. No pertinent family history.  Social History:  reports that she has never smoked. She has never used smokeless tobacco. She reports that she does not drink alcohol and does not use drugs.  Allergies: No Known Allergies  Medications reviewed.    ROS Full ROS performed and is otherwise negative other than what is stated in HPI   BP 130/84    Pulse (!) 111    Temp (!) 97.5 F (36.4 C) (Temporal)    Ht 5\' 1"  (1.549 m)    Wt 132 lb (59.9 kg)    SpO2 97%    BMI 24.94 kg/m   Physical Exam CONSTITUTIONAL: NAD EYES: Pupils are equal, round, and reactive , Sclera are non-icteric. EARS, NOSE, MOUTH AND THROAT: She is wearing a mask. Hearing is intact to voice. LYMPH NODES:  Lymph nodes in the neck are normal. RESPIRATORY:  Lungs are clear. There is normal respiratory effort, with equal breath sounds bilaterally, and without pathologic use of accessory muscles. CARDIOVASCULAR: Heart is regular without murmurs, gallops, or rubs. GI: The abdomen is soft, nontender, and nondistended. There is a palpable mass, located to the left of the abdominal wall.  This is between the left subcostal margin and the left ASIS.  It is within the abdominal wall and is solid.   I am able to move it and does not seem to be fixed to any bony or deep structures.  There is no hepatosplenomegaly. There are normal bowel sounds in all quadrants. GU: Rectal deferred.   MUSCULOSKELETAL: Normal muscle strength and tone. No cyanosis or edema.   SKIN: Turgor is good and there are no pathologic skin lesions or ulcers. NEUROLOGIC: Motor and sensation is grossly normal. Cranial nerves are grossly intact. PSYCH:  Oriented to person, place and time. Affect is normal.   No results found for this or any previous visit (from the past 48 hour(s)). MR Abdomen W Wo Contrast  Result Date: 11/06/2019 CLINICAL DATA:  Left anterior abdominal wall mass. EXAM: MRI ABDOMEN WITHOUT AND WITH CONTRAST TECHNIQUE: Multiplanar multisequence MR imaging of the abdomen was performed both before and after the administration of intravenous contrast. CONTRAST:  48mL GADAVIST GADOBUTROL 1 MMOL/ML IV SOLN COMPARISON:  Ultrasound and CT abdomen dated Oct 13, 2019. FINDINGS: Within the left external oblique muscle, there is an oval, well-defined predominantly T2 hyperintense, T1 isointense, heterogeneously enhancing mass measuring 2.5 x 1.5 x 4.4 cm, previously 2.7 x 1.4 x 4.5 cm on CT (by my measurements). Mass demonstrates few streaky areas of low T2 signal. There is no surrounding soft tissue edema. The visualized intra-abdominal contents are unremarkable. IMPRESSION: 1. 4.4 cm solid mass in the left external oblique muscle. Although the imaging appearance is nonspecific, this most likely  represents a desmoid tumor given the patient's age and gender. Confirmation with tissue sampling is recommended to exclude the unlikely possibility of a sarcoma. Electronically Signed   By: Titus Dubin M.D.   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  I had an extensive discussion with the patient regarding her disease process.  Given symptoms I definitely recommend excision.   Also discussed with her that this is different than a sarcoma but the only way to truly differentiated is after excision.  I do not want to perform a Tru-Cut biopsy as this will not change anything management and I do think that the pathologist will have better tissue higher yield with an excisional biopsy specimen.  I also discussed with her the importance of surveillance colonoscopy given the fact that this has been associated with FAP.  Currently patient wishes to address the abdominal wall mass first.  I do think that is reasonable to proceed with excision of abdominal wall mass.  Procedure discussed with the patient in detail.  Risks, benefits and possible applications including but not limited to: Bleeding, infection, significant chance of recurrence, abdominal wall hernias and low probability the pathology being upgraded to sarcoma.  Exensive counseling provided   Greater than 50% of the 40 minutes  visit was spent in counseling/coordination of care   Caroleen Hamman, MD [place] Surgeon

## 2019-11-09 ENCOUNTER — Other Ambulatory Visit: Payer: Self-pay

## 2019-11-09 ENCOUNTER — Encounter
Admission: RE | Admit: 2019-11-09 | Discharge: 2019-11-09 | Disposition: A | Discharge status: Home / Self Care | Payer: Self-pay | Source: Ambulatory Visit | Attending: Surgery | Admitting: Surgery

## 2019-11-09 HISTORY — DX: Anemia, unspecified: D64.9

## 2019-11-09 NOTE — Patient Instructions (Signed)
Your procedure is scheduled on: Tuesday November 13, 2019. Su procedimiento est programado para: [place] 2021 Report to Day Surgery inside [facility] 2nd floor. Presntese a: Veterinary surgeon, [place] 2do piso.  To find out your arrival time please call [phone] between 1PM - 3PM on Monday November 12, 2019. Para saber su hora de llegada por favor llame al [phone] entre la 1PM - 3PM el da: [place] 2021.   Remember: Instructions that are not followed completely may result in serious medical risk, up to and including death,  or upon the discretion of your surgeon and anesthesiologist your surgery may need to be rescheduled.  Recuerde: Las instrucciones que no se siguen completamente Heritage manager en un riesgo de salud grave, incluyendo hasta  la [place] o a discrecin de su cirujano y Environmental health practitioner, su ciruga se puede posponer.   __X_ 1.Do not eat food after midnight the night before your procedure. No    gum chewing or hard candies. You may drink clear liquids up to 2 hours     before you are scheduled to arrive for your surgery- DO not drink clear     Liquids within 2 hours of the start of your surgery.     Clear Liquids include:    water, apple juice without pulp, clear carbohydrate drink such as    Clearfast of [place], Black Coffee or Tea (Do not add anything to coffee or tea).      No coma nada despus de la medianoche de la noche anterior a su    procedimiento. No coma chicles ni caramelos duros. Puede tomar    lquidos claros hasta 2 horas antes de su hora programada de llegada al     hospital para su procedimiento. No tome lquidos claros durante el     transcurso de las 2 horas de su llegada programada al hospital para su     procedimiento, ya que esto puede llevar a que su procedimiento se    retrase o tenga que volver a Health and safety inspector.  Los lquidos claros incluyen:          - Agua o jugo de [place] sin pulpa          -  Bebidas claras con carbohidratos como ClearFast o Gatorade          - Caf negro o t claro (sin leche, sin cremas, no agregue nada al caf ni al t)  No tome nada que no est en esta lista.  Los pacientes con diabetes tipo 1 y tipo 2 solo deben Agricultural engineer.  Llame a la clnica de [facility] o a la unidad de Same Day Surgery si  tiene alguna pregunta sobre estas instrucciones.   __X__2.  On the morning of surgery brush your teeth with toothpaste and water, you may rinse your mouth with mouthwash if you wish.  Do not swallow any toothpaste of mouthwash. En la maana de la [place], cepllese los dientes con pasta de dientes y [place], [place] enjuagarse la boca con enjuague bucal si lo desea. No trague ninguna pasta de dientes o enjuague bucal.              _X__ 3.Do Not Smoke or use e-cigarettes For 24 Hours Prior to Your Surgery.    Do not use any chewable tobacco products for at least 6   hours prior to surgery.    No fume ni use cigarrillos [place] 24  horas previas    a su ciruga.  No use ningn producto de tabaco masticable durante   al menos 6 horas antes de la [place].     __X_ 4. No alcohol for 24 hours before or after surgery.    No tome alcohol durante las 24 horas antes ni despus de la [place].    __X__ 5. Notify your doctor if there is any change in your medical condition (cold,fever, infections).    Informe a su mdico si hay algn cambio en su condicin mdica  (resfriado, fiebre, infecciones).   Do not wear jewelry, make-up, hairpins, clips or nail polish.  No use joyas, maquillajes, pinzas/ganchos para el cabello ni esmalte de uas.  Do not wear lotions, powders, or perfumes. You may wear deodorant.  No use lociones, polvos o perfumes.  Puede usar desodorante.    Do not shave 48 hours prior to surgery. Men may shave face and neck.  No se afeite 48 horas antes de la [place].  Los hombres pueden [employer] cara  y el cuello.   Do not bring valuables to the  hospital.   No lleve objetos [facility] is not responsible for any belongings or valuables.  [place] no se hace responsable de ningn tipo de pertenencias u objetos de Geographical information systems officer.               Contacts, dentures or bridgework may not be worn into surgery.  Los lentes de [place], las dentaduras postizas o puentes no se pueden usar en la [place].   Leave your suitcase in the car. After surgery it may be brought to your room.  Deje su maleta en el auto.  Despus de la ciruga podr traerla a su habitacin.   For patients admitted to the hospital, discharge time is determined by your  treatment team.  Para los pacientes que sean ingresados al hospital, el tiempo en el cual se le  dar de alta es determinado por su equipo de [place].   Patients discharged the day of surgery will not be allowed to drive home. A los pacientes que se les da de alta el mismo da de la ciruga no se les permitir conducir a Holiday representative.    ____ Take these medicines the morning of surgery with A SIP OF WATER:          [employer] medicinas la maana de la ciruga con UN SORBO DE AGUA:  1. None    __X__ Use CHG Soap as directed          Utilice el jabn de CHG segn lo indicado  __X__ Stop Anti-inflammatories such as ibuprofen, Aleve, naproxen, aspirin and or BC powders.           Deje de tomar antiinflamatorios como ibuprofen, Aleve, naproxen, aspirinas y polvos de BC powders.   __X__ Stop supplements until after surgery            Deje de tomar suplementos hasta despus de la ciruga  __X__ Do not start any herbal supplements before your surgery.   No empiece a tomar suplementos de hierbas antes de la [place].

## 2019-11-12 ENCOUNTER — Other Ambulatory Visit: Payer: Self-pay

## 2019-11-12 ENCOUNTER — Other Ambulatory Visit
Admission: RE | Admit: 2019-11-12 | Discharge: 2019-11-12 | Disposition: A | Discharge status: Home / Self Care | Payer: Self-pay | Source: Ambulatory Visit | Attending: Surgery | Admitting: Surgery

## 2019-11-12 DIAGNOSIS — Z20822 Contact with and (suspected) exposure to covid-19: Secondary | ICD-10-CM | POA: Insufficient documentation

## 2019-11-12 DIAGNOSIS — Z01812 Encounter for preprocedural laboratory examination: Secondary | ICD-10-CM | POA: Insufficient documentation

## 2019-11-12 LAB — SARS CORONAVIRUS 2 (TAT 6-24 HRS): SARS Coronavirus 2: NEGATIVE

## 2019-11-13 ENCOUNTER — Encounter: Payer: Self-pay | Admitting: Surgery

## 2019-11-13 ENCOUNTER — Ambulatory Visit
Admission: RE | Admit: 2019-11-13 | Discharge: 2019-11-13 | Disposition: A | Discharge status: Home / Self Care | Payer: Self-pay | Attending: Surgery | Admitting: Surgery

## 2019-11-13 ENCOUNTER — Ambulatory Visit: Payer: Self-pay | Admitting: Certified Registered"

## 2019-11-13 ENCOUNTER — Encounter
Admission: RE | Disposition: A | Discharge status: Home / Self Care | Payer: Self-pay | Source: Home / Self Care | Attending: Surgery

## 2019-11-13 DIAGNOSIS — D481 Neoplasm of uncertain behavior of connective and other soft tissue: Secondary | ICD-10-CM

## 2019-11-13 DIAGNOSIS — D214 Benign neoplasm of connective and other soft tissue of abdomen: Secondary | ICD-10-CM | POA: Insufficient documentation

## 2019-11-13 HISTORY — PX: EXCISION OF ABDOMINAL WALL TUMOR: SHX6687

## 2019-11-13 LAB — POCT PREGNANCY, URINE: Preg Test, Ur: NEGATIVE

## 2019-11-13 SURGERY — EXCISION, NEOPLASM, ABDOMINAL WALL
Anesthesia: General

## 2019-11-13 MED ORDER — PROPOFOL 10 MG/ML IV BOLUS
INTRAVENOUS | Status: DC | PRN
  Administered 2019-11-13: 150 mg via INTRAVENOUS

## 2019-11-13 MED ORDER — ACETAMINOPHEN 500 MG PO TABS
ORAL_TABLET | ORAL | Status: AC
  Filled 2019-11-13: qty 2

## 2019-11-13 MED ORDER — LACTATED RINGERS IV SOLN: INTRAVENOUS | Status: DC

## 2019-11-13 MED ORDER — ACETAMINOPHEN 500 MG PO TABS
1000.0000 mg | ORAL_TABLET | ORAL | Status: AC
  Administered 2019-11-13: 1000 mg via ORAL

## 2019-11-13 MED ORDER — GABAPENTIN 300 MG PO CAPS
ORAL_CAPSULE | ORAL | Status: AC
  Filled 2019-11-13: qty 1

## 2019-11-13 MED ORDER — GABAPENTIN 300 MG PO CAPS
300.0000 mg | ORAL_CAPSULE | ORAL | Status: AC
  Administered 2019-11-13: 300 mg via ORAL

## 2019-11-13 MED ORDER — ROCURONIUM BROMIDE 100 MG/10ML IV SOLN
INTRAVENOUS | Status: DC | PRN
  Administered 2019-11-13: 60 mg via INTRAVENOUS

## 2019-11-13 MED ORDER — BUPIVACAINE LIPOSOME 1.3 % IJ SUSP
INTRAMUSCULAR | Status: AC
  Filled 2019-11-13: qty 20

## 2019-11-13 MED ORDER — KETOROLAC TROMETHAMINE 30 MG/ML IJ SOLN
INTRAMUSCULAR | Status: AC
  Filled 2019-11-13: qty 1

## 2019-11-13 MED ORDER — ACETAMINOPHEN 10 MG/ML IV SOLN
INTRAVENOUS | Status: DC | PRN
Start: 2019-11-13 — End: 2019-11-13
  Administered 2019-11-13: 500 mg via INTRAVENOUS

## 2019-11-13 MED ORDER — LIDOCAINE HCL (CARDIAC) PF 100 MG/5ML IV SOSY
PREFILLED_SYRINGE | INTRAVENOUS | Status: DC | PRN
  Administered 2019-11-13: 100 mg via INTRAVENOUS

## 2019-11-13 MED ORDER — PROPOFOL 10 MG/ML IV BOLUS
INTRAVENOUS | Status: AC
  Filled 2019-11-13: qty 20

## 2019-11-13 MED ORDER — HYDROCODONE-ACETAMINOPHEN 5-325 MG PO TABS: 1.0000 | ORAL_TABLET | ORAL | 0 refills | Status: DC | PRN

## 2019-11-13 MED ORDER — MIDAZOLAM HCL 2 MG/2ML IJ SOLN
INTRAMUSCULAR | Status: AC
  Filled 2019-11-13: qty 2

## 2019-11-13 MED ORDER — SUGAMMADEX SODIUM 500 MG/5ML IV SOLN: INTRAVENOUS | Status: DC | PRN

## 2019-11-13 MED ORDER — BUPIVACAINE-EPINEPHRINE (PF) 0.25% -1:200000 IJ SOLN
INTRAMUSCULAR | Status: DC | PRN
  Administered 2019-11-13: 30 mL

## 2019-11-13 MED ORDER — KETOROLAC TROMETHAMINE 30 MG/ML IJ SOLN: INTRAMUSCULAR | Status: DC | PRN

## 2019-11-13 MED ORDER — CEFAZOLIN SODIUM-DEXTROSE 2-4 GM/100ML-% IV SOLN
2.0000 g | INTRAVENOUS | Status: AC
  Administered 2019-11-13: 2 g via INTRAVENOUS

## 2019-11-13 MED ORDER — CHLORHEXIDINE GLUCONATE 0.12 % MT SOLN: 15.0000 mL | Freq: Once | OROMUCOSAL | Status: AC

## 2019-11-13 MED ORDER — ONDANSETRON HCL 4 MG/2ML IJ SOLN
INTRAMUSCULAR | Status: DC | PRN
  Administered 2019-11-13: 4 mg via INTRAVENOUS

## 2019-11-13 MED ORDER — CEFAZOLIN SODIUM-DEXTROSE 2-4 GM/100ML-% IV SOLN
INTRAVENOUS | Status: AC
  Filled 2019-11-13: qty 100

## 2019-11-13 MED ORDER — BUPIVACAINE LIPOSOME 1.3 % IJ SUSP
INTRAMUSCULAR | Status: DC | PRN
  Administered 2019-11-13: 20 mL

## 2019-11-13 MED ORDER — FAMOTIDINE 20 MG PO TABS: 20.0000 mg | ORAL_TABLET | Freq: Once | ORAL | Status: AC

## 2019-11-13 MED ORDER — ONDANSETRON HCL 4 MG/2ML IJ SOLN
INTRAMUSCULAR | Status: AC
  Filled 2019-11-13: qty 2

## 2019-11-13 MED ORDER — SUGAMMADEX SODIUM 500 MG/5ML IV SOLN
INTRAVENOUS | Status: DC | PRN
  Administered 2019-11-13: 200 mg via INTRAVENOUS

## 2019-11-13 MED ORDER — CHLORHEXIDINE GLUCONATE CLOTH 2 % EX PADS
6.0000 | MEDICATED_PAD | Freq: Once | CUTANEOUS | Status: AC
  Administered 2019-11-13: 6 via TOPICAL

## 2019-11-13 MED ORDER — FAMOTIDINE 20 MG PO TABS
ORAL_TABLET | ORAL | Status: AC
  Administered 2019-11-13: 20 mg via ORAL
  Filled 2019-11-13: qty 1

## 2019-11-13 MED ORDER — FENTANYL CITRATE (PF) 250 MCG/5ML IJ SOLN
INTRAMUSCULAR | Status: AC
  Filled 2019-11-13: qty 5

## 2019-11-13 MED ORDER — ACETAMINOPHEN 10 MG/ML IV SOLN
INTRAVENOUS | Status: AC
  Filled 2019-11-13: qty 100

## 2019-11-13 MED ORDER — ORAL CARE MOUTH RINSE: 15.0000 mL | Freq: Once | OROMUCOSAL | Status: AC

## 2019-11-13 MED ORDER — ONDANSETRON HCL 4 MG/2ML IJ SOLN: 4.0000 mg | Freq: Once | INTRAMUSCULAR | Status: DC | PRN

## 2019-11-13 MED ORDER — FENTANYL CITRATE (PF) 100 MCG/2ML IJ SOLN
INTRAMUSCULAR | Status: DC | PRN
  Administered 2019-11-13: 100 ug via INTRAVENOUS
  Administered 2019-11-13: 150 ug via INTRAVENOUS

## 2019-11-13 MED ORDER — LIDOCAINE HCL (PF) 2 % IJ SOLN
INTRAMUSCULAR | Status: AC
  Filled 2019-11-13: qty 5

## 2019-11-13 MED ORDER — SODIUM CHLORIDE FLUSH 0.9 % IV SOLN
INTRAVENOUS | Status: AC
  Filled 2019-11-13: qty 50

## 2019-11-13 MED ORDER — CELECOXIB 200 MG PO CAPS
200.0000 mg | ORAL_CAPSULE | ORAL | Status: AC
  Administered 2019-11-13: 200 mg via ORAL

## 2019-11-13 MED ORDER — SEVOFLURANE IN SOLN
RESPIRATORY_TRACT | Status: AC
  Filled 2019-11-13: qty 250

## 2019-11-13 MED ORDER — FENTANYL CITRATE (PF) 100 MCG/2ML IJ SOLN: 25.0000 ug | INTRAMUSCULAR | Status: DC | PRN

## 2019-11-13 MED ORDER — CHLORHEXIDINE GLUCONATE 0.12 % MT SOLN
OROMUCOSAL | Status: AC
  Administered 2019-11-13: 15 mL via OROMUCOSAL
  Filled 2019-11-13: qty 15

## 2019-11-13 MED ORDER — MIDAZOLAM HCL 2 MG/2ML IJ SOLN
INTRAMUSCULAR | Status: DC | PRN
  Administered 2019-11-13: 2 mg via INTRAVENOUS

## 2019-11-13 MED ORDER — CELECOXIB 200 MG PO CAPS
ORAL_CAPSULE | ORAL | Status: AC
  Filled 2019-11-13: qty 1

## 2019-11-13 SURGICAL SUPPLY — 55 items
APPLIER CLIP 11 MED OPEN (CLIP)
APPLIER CLIP 13 LRG OPEN (CLIP)
BARRIER ADH SEPRAFILM 3INX5IN (MISCELLANEOUS) IMPLANT
BLADE CLIPPER SURG (BLADE) IMPLANT
BLADE SURG SZ10 CARB STEEL (BLADE) ×6 IMPLANT
BULB RESERV EVAC DRAIN JP 100C (MISCELLANEOUS) ×3 IMPLANT
CANISTER SUCT 3000ML PPV (MISCELLANEOUS) ×3 IMPLANT
CHLORAPREP W/TINT 26 (MISCELLANEOUS) ×3 IMPLANT
CLIP APPLIE 11 MED OPEN (CLIP) IMPLANT
CLIP APPLIE 13 LRG OPEN (CLIP) IMPLANT
COVER BACK TABLE REUSABLE LG (DRAPES) ×3 IMPLANT
COVER WAND RF STERILE (DRAPES) ×3 IMPLANT
DERMABOND ADVANCED (GAUZE/BANDAGES/DRESSINGS) ×2
DERMABOND ADVANCED .7 DNX12 (GAUZE/BANDAGES/DRESSINGS) ×1 IMPLANT
DRAIN CHANNEL JP 15F RND 16 (MISCELLANEOUS) ×3 IMPLANT
DRAIN CHANNEL JP 19F (MISCELLANEOUS) IMPLANT
DRAPE LAPAROTOMY 100X77 ABD (DRAPES) ×3 IMPLANT
DRSG TEGADERM 2-3/8X2-3/4 SM (GAUZE/BANDAGES/DRESSINGS) ×6 IMPLANT
DRSG TELFA 3X8 NADH (GAUZE/BANDAGES/DRESSINGS) ×3 IMPLANT
ELECT BLADE 6.5 EXT (BLADE) ×3 IMPLANT
ELECT REM PT RETURN 9FT ADLT (ELECTROSURGICAL) ×3
ELECTRODE REM PT RTRN 9FT ADLT (ELECTROSURGICAL) ×1 IMPLANT
GAUZE SPONGE 4X4 12PLY STRL (GAUZE/BANDAGES/DRESSINGS) ×6 IMPLANT
GLOVE BIO SURGEON STRL SZ7 (GLOVE) ×3 IMPLANT
GOWN STRL REUS W/ TWL LRG LVL3 (GOWN DISPOSABLE) ×2 IMPLANT
GOWN STRL REUS W/TWL LRG LVL3 (GOWN DISPOSABLE) ×4
HANDLE SUCTION POOLE (INSTRUMENTS) ×1 IMPLANT
HANDLE YANKAUER SUCT BULB TIP (MISCELLANEOUS) ×3 IMPLANT
LIGASURE IMPACT 36 18CM CVD LR (INSTRUMENTS) IMPLANT
NEEDLE HYPO 22GX1.5 SAFETY (NEEDLE) ×6 IMPLANT
NEEDLE HYPO 25X1 1.5 SAFETY (NEEDLE) ×3 IMPLANT
PACK BASIN MAJOR (MISCELLANEOUS) ×3 IMPLANT
RELOAD PROXIMATE 75MM BLUE (ENDOMECHANICALS) IMPLANT
SPONGE LAP 18X18 RF (DISPOSABLE) ×3 IMPLANT
SPONGE LAP 18X36 RFD (DISPOSABLE) IMPLANT
STAPLER PROXIMATE 75MM BLUE (STAPLE) IMPLANT
STAPLER SKIN PROX 35W (STAPLE) IMPLANT
SUCTION POOLE HANDLE (INSTRUMENTS) ×3
SUT ETHILON 3-0 FS-10 30 BLK (SUTURE) ×3
SUT MNCRL 4-0 (SUTURE) ×2
SUT MNCRL 4-0 27XMFL (SUTURE) ×1
SUT PDS AB 0 CT1 27 (SUTURE) ×27 IMPLANT
SUT SILK 2 0 (SUTURE)
SUT SILK 2 0 SH CR/8 (SUTURE) IMPLANT
SUT SILK 2 0SH CR/8 30 (SUTURE) ×3 IMPLANT
SUT SILK 2-0 18XBRD TIE 12 (SUTURE) IMPLANT
SUT VIC AB 0 CT1 36 (SUTURE) ×6 IMPLANT
SUT VIC AB 2-0 SH 27 (SUTURE) ×12
SUT VIC AB 2-0 SH 27XBRD (SUTURE) ×6 IMPLANT
SUTURE EHLN 3-0 FS-10 30 BLK (SUTURE) ×1 IMPLANT
SUTURE MNCRL 4-0 27XMF (SUTURE) ×1 IMPLANT
SYR 30ML LL (SYRINGE) ×6 IMPLANT
SYR 3ML LL SCALE MARK (SYRINGE) ×3 IMPLANT
TAPE MICROFOAM 4IN (TAPE) ×3 IMPLANT
TRAY FOLEY MTR SLVR 16FR STAT (SET/KITS/TRAYS/PACK) IMPLANT

## 2019-11-13 NOTE — Interval H&P Note (Signed)
History and Physical Interval Note:  11/13/2019 8:51 AM  Brittany Richards  has presented today for surgery, with the diagnosis of Desmoid tumor.  The various methods of treatment have been discussed with the patient and family. After consideration of risks, benefits and other options for treatment, the patient has consented to  Procedure(s): EXCISION OF ABDOMINAL WALL TUMOR (N/A) as a surgical intervention.  The patient's history has been reviewed, patient examined, no change in status, stable for surgery.  I have reviewed the patient's chart and labs.  Questions were answered to the patient's satisfaction.     [employer]

## 2019-11-13 NOTE — Discharge Instructions (Addendum)
Bupivacaine Liposomal Suspension for Injection °What is this medicine? °BUPIVACAINE LIPOSOMAL (bue PIV a kane LIP oh som al) is an anesthetic. It causes loss of feeling in the skin or other tissues. It is used to prevent and to treat pain from some procedures. °This medicine may be used for other purposes; ask your health care provider or pharmacist if you have questions. °COMMON BRAND NAME(S): EXPAREL °What should I tell my health care provider before I take this medicine? °They need to know if you have any of these conditions: °· G6PD deficiency °· heart disease °· kidney disease °· liver disease °· low blood pressure °· lung or breathing disease, like asthma °· an unusual or allergic reaction to bupivacaine, other medicines, foods, dyes, or preservatives °· pregnant or trying to get pregnant °· breast-feeding °How should I use this medicine? °This medicine is for injection into the affected area. It is given by a health care professional in a hospital or clinic setting. °Talk to your pediatrician regarding the use of this medicine in children. Special care may be needed. °Overdosage: If you think you have taken too much of this medicine contact a poison control center or emergency room at once. °NOTE: This medicine is only for you. Do not share this medicine with others. °What if I miss a dose? °This does not apply. °What may interact with this medicine? °This medicine may interact with the following medications: °· acetaminophen °· certain antibiotics like dapsone, nitrofurantoin, aminosalicylic acid, sulfonamides °· certain medicines for seizures like phenobarbital, phenytoin, valproic acid °· chloroquine °· cyclophosphamide °· flutamide °· hydroxyurea °· ifosfamide °· metoclopramide °· nitric oxide °· nitroglycerin °· nitroprusside °· nitrous oxide °· other local anesthetics like lidocaine, pramoxine, tetracaine °· primaquine °· quinine °· rasburicase °· sulfasalazine °This list may not describe all possible  interactions. Give your health care provider a list of all the medicines, herbs, non-prescription drugs, or dietary supplements you use. Also tell them if you smoke, drink alcohol, or use illegal drugs. Some items may interact with your medicine. °What should I watch for while using this medicine? °Your condition will be monitored carefully while you are receiving this medicine. °Be careful to avoid injury while the area is numb, and you are not aware of pain. °What side effects may I notice from receiving this medicine? °Side effects that you should report to your doctor or health care professional as soon as possible: °· allergic reactions like skin rash, itching or hives, swelling of the face, lips, or tongue °· seizures °· signs and symptoms of a dangerous change in heartbeat or heart rhythm like chest pain; dizziness; fast, irregular heartbeat; palpitations; feeling faint or lightheaded; falls; breathing problems °· signs and symptoms of methemoglobinemia such as pale, gray, or blue colored skin; headache; fast heartbeat; shortness of breath; feeling faint or lightheaded, falls; tiredness °Side effects that usually do not require medical attention (report to your doctor or health care professional if they continue or are bothersome): °· anxious °· back pain °· changes in taste °· changes in vision °· constipation °· dizziness °· fever °· nausea, vomiting °This list may not describe all possible side effects. Call your doctor for medical advice about side effects. You may report side effects to FDA at 1-800-FDA-1088. °Where should I keep my medicine? °This drug is given in a hospital or clinic and will not be stored at home. °NOTE: This sheet is a summary. It may not cover all possible information. If you have questions about this   medicine, talk to your doctor, pharmacist, or health care provider. °© 2020 [employer]vier/Gold Standard (2019-02-20 10:48:23) ° ° °AMBULATORY SURGERY  °DISCHARGE INSTRUCTIONS ° ° °1) The  drugs that you were given will stay in your system until tomorrow so for the next 24 hours you should not: ° °A) Drive an automobile °B) Make any legal decisions °C) Drink any alcoholic beverage ° ° °2) You may resume regular meals tomorrow.  Today it is better to start with liquids and gradually work up to solid foods. ° °You may eat anything you prefer, but it is better to start with liquids, then soup and crackers, and gradually work up to solid foods. ° ° °3) Please notify your doctor immediately if you have any unusual bleeding, trouble breathing, redness and pain at the surgery site, drainage, fever, or pain not relieved by medication. ° ° ° °4) Additional Instructions: ° ° ° ° ° ° ° °Please contact your physician with any problems or Same Day Surgery at [phone], Monday through Friday 6 am to 4 pm, or [facility] Main number at [phone]. °

## 2019-11-13 NOTE — Anesthesia Preprocedure Evaluation (Signed)
Anesthesia Evaluation  Patient identified by MRN, date of birth, ID band Patient awake    Reviewed: Allergy & Precautions, NPO status , Patient's Chart, lab work & pertinent test results  Airway Mallampati: III  TM Distance: >3 FB     Dental  (+) Chipped   Pulmonary neg pulmonary ROS,    Pulmonary exam normal        Cardiovascular negative cardio ROS Normal cardiovascular exam     Neuro/Psych negative neurological ROS  negative psych ROS   GI/Hepatic Neg liver ROS,   Endo/Other  negative endocrine ROS  Renal/GU negative Renal ROS  negative genitourinary   Musculoskeletal negative musculoskeletal ROS (+)   Abdominal Normal abdominal exam  (+)   Peds negative pediatric ROS (+)  Hematology  (+) anemia ,   Anesthesia Other Findings Past Medical History: No date: Anemia Talked with patient through translator.  Reproductive/Obstetrics negative OB ROS                             Anesthesia Physical Anesthesia Plan  ASA: II  Anesthesia Plan: General   Post-op Pain Management:    Induction: Intravenous  PONV Risk Score and Plan:   Airway Management Planned: Oral ETT  Additional Equipment:   Intra-op Plan:   Post-operative Plan: Extubation in OR  Informed Consent: I have reviewed the patients History and Physical, chart, labs and discussed the procedure including the risks, benefits and alternatives for the proposed anesthesia with the patient or authorized representative who has indicated his/her understanding and acceptance.     Dental advisory given  Plan Discussed with: CRNA and Surgeon  Anesthesia Plan Comments:         Anesthesia Quick Evaluation

## 2019-11-13 NOTE — Anesthesia Procedure Notes (Signed)
Procedure Name: Intubation Performed by: Gaynelle Cage, CRNA Pre-anesthesia Checklist: Patient identified, Emergency Drugs available, Suction available and Patient being monitored Patient Re-evaluated:Patient Re-evaluated prior to induction Oxygen Delivery Method: Circle system utilized Preoxygenation: Pre-oxygenation with 100% oxygen Induction Type: IV induction Ventilation: Mask ventilation without difficulty Laryngoscope Size: Mac and 3 Grade View: Grade II Tube type: Oral Tube size: 6.5 mm Number of attempts: 1 Airway Equipment and Method: Oral airway Placement Confirmation: ETT inserted through vocal cords under direct vision,  positive ETCO2 and breath sounds checked- equal and bilateral Tube secured with: Tape Dental Injury: Teeth and Oropharynx as per pre-operative assessment

## 2019-11-13 NOTE — Anesthesia Postprocedure Evaluation (Signed)
Anesthesia Post Note  Patient: Brittany Richards  Procedure(s) Performed: EXCISION OF ABDOMINAL WALL TUMOR (N/A )  Patient location during evaluation: PACU Anesthesia Type: General Level of consciousness: awake and alert and oriented Pain management: pain level controlled Vital Signs Assessment: post-procedure vital signs reviewed and stable Respiratory status: spontaneous breathing Cardiovascular status: blood pressure returned to baseline Anesthetic complications: no   No complications documented.   Last Vitals:  Vitals:   11/13/19 1212 11/13/19 1231  BP: 129/84 126/84  Pulse: 76 72  Resp: 18 16  Temp: 36.8 C   SpO2: 100% 99%    Last Pain:  Vitals:   11/13/19 1231  TempSrc:   PainSc: 0-No pain                 Zahava Quant

## 2019-11-13 NOTE — Op Note (Signed)
PROCEDURES: 1. En-Bloque resection of Abdominal wall mass 2. Abdominal wall reconstruction with Left myocutaneous flaps 3. Placement of 15 blake drain  Pre-operative Diagnosis:Abdominal wall mass  Post-operative Diagnosis: Same   Surgeon: Marjory Lies Klaudia Beirne   Anesthesia: General endotracheal anesthesia  ASA Class: 2  Surgeon: Caroleen Hamman , MD FACS  Anesthesia: Gen. with endotracheal tube   Findings: 4 cm abdominal wall mass involving external oblique muscle  Estimated Blood Loss: 10cc                       Complications: none        Condition: stable  Procedure Details  The patient was seen again in the Holding Room. The benefits, complications, treatment options, and expected outcomes were discussed with the patient. The risks of bleeding, infection, chronic pain, re-excision, recurrence of symptoms, failure to resolve symptoms,  bowel injury, any of which could require further surgery were reviewed with the patient.   The patient was taken to Operating Room, identified  and the procedure verified.  A Time Out was held and the above information confirmed.  Elliptical incision was created to incorporate the tumor on the skin.  We develop subcutaneous flaps so we could perform an en bloc dissection.  We felt the mass and it was attached to the external oblique muscle.  Doing an en bloc resection we made sure we had at least a 1/2 cm margin single referentially.  The fascia of the external oblique was incised and also the muscle was divided circumferentially to [employer] and to excise the desmoid tumor in an en bloc fashion to obtain generous negative margins. The muscle and the mass were excised and I oriented the specimen in the standard fashion.  I discussed the case with pathology and we sent it for permanent pathology.  Radical excisions left knee with a large defect within the abdominal wall.  This require myocutaneous flaps in order to have a tension-free repair of the fascia  and avoid any future hernias.  We develop a cutaneous flap also we were able to divide the subcutaneous tissue attached to the external oblique fascia we perform this circumferentially and very generously.  We also were able to identify the lateral border of the rectus sheath and were able to mobilize the subcutaneous tissue from the rectus sheath of the abdominal wall.  This allowed ample mobilization of the subcutaneous tissue.  Attention then was turned to the external oblique and we were able to also mobilize the external oblique muscle from the internal oblique muscle using electrocautery.  This allowed [place] to have adequate mobilization of the external oblique muscle so that the repair was under no tension.  After the muscle flap was developed were able to close the external oblique fascia to each other with multiple interrupted 0 PDS sutures.  I then was able to place a 15 Blake drain on top of the fascia.  I closed the Scarpa's fascia in a running fashion with a 2-0 Vicryl and the dermis was closed with multiple 2-0 Vicryl sutures.  Please also note that I have to perform a significant elliptical incision of the skin because the defect left me with significant excess tissue so for cosmetic purposes I excised the extra skin and increase my incision size. 4-0 Monocryl was used to close the skin in a subcuticular fashion.  I also was able to perform intercostal nerve block on the left side and abdominal wall block using liposomal Marcaine under  direct visualization.  I Did not feel that the addition of mesh was going to change anything and potentially could complicate future resections if the desmoid tumor recurs.  Needle and laparotomy count were correct and there were no immediate complications  Caroleen Hamman, MD, FACS

## 2019-11-13 NOTE — Transfer of Care (Signed)
Immediate Anesthesia Transfer of Care Note  Patient: Brittany Richards  Procedure(s) Performed: EXCISION OF ABDOMINAL WALL TUMOR (N/A )  Patient Location: PACU  Anesthesia Type:General  Level of Consciousness: drowsy and patient cooperative  Airway & Oxygen Therapy: Patient Spontanous Breathing and Patient connected to face mask oxygen  Post-op Assessment: Report given to RN and Post -op Vital signs reviewed and stable  Post vital signs: Reviewed and stable  Last Vitals:  Vitals Value Taken Time  BP 136/90 11/13/19 1128  Temp 36.4 C 11/13/19 1128  Pulse 76 11/13/19 1137  Resp    SpO2 100 % 11/13/19 1137  Vitals shown include unvalidated device data.  Last Pain:  Vitals:   11/13/19 1128  TempSrc:   PainSc: Asleep         Complications: No complications documented.

## 2019-11-14 ENCOUNTER — Encounter: Payer: Self-pay | Admitting: Surgery

## 2019-11-16 LAB — SURGICAL PATHOLOGY

## 2019-11-21 ENCOUNTER — Encounter: Payer: Self-pay | Admitting: Surgery

## 2019-11-21 ENCOUNTER — Other Ambulatory Visit: Payer: Self-pay

## 2019-11-21 ENCOUNTER — Ambulatory Visit (INDEPENDENT_AMBULATORY_CARE_PROVIDER_SITE_OTHER): Payer: Self-pay | Admitting: Surgery

## 2019-11-21 VITALS — BP 124/82 | HR 116 | Temp 97.9°F | Resp 12

## 2019-11-21 DIAGNOSIS — Z09 Encounter for follow-up examination after completed treatment for conditions other than malignant neoplasm: Secondary | ICD-10-CM

## 2019-11-21 NOTE — Patient Instructions (Addendum)
Dr Dahlia Byes removed your drain today and placed a dry gauze on top of the wound area. Change the gauze once a day as needed until the wound is healed.  Start using ice packs on the area to help with swelling.   El Dr. Dahlia Byes quit el drenaje hoy y coloc una gasa seca en la parte superior del rea de la herida. Cambie la gasa una vez al da segn sea necesario hasta que la herida sane. Comience a usar bolsas de hielo en el rea para ayudar con la hinchazn.  See your appointment below, call the office if you have any questions or concerns.   Llame la oficina si tiene alguna pregunta o inquitud.

## 2019-11-22 ENCOUNTER — Encounter: Payer: Self-pay | Admitting: Surgery

## 2019-11-22 NOTE — Progress Notes (Signed)
S/p desmoid tumor excision abd wall Doing well Drain less 20cc day No fevers or chills  PE NAD Abd: soft, mild incisional tenderness, peritonitis no evidence of expanding hematoma.  Drain with serosanguineous fluid.  I removed it.  A/p  Doing well status post excision of desmoid negative margins. Wound clinic in 2 weeks for wound check.

## 2019-11-27 ENCOUNTER — Emergency Department: Payer: Medicaid Other

## 2019-11-27 ENCOUNTER — Inpatient Hospital Stay
Admission: EM | Admit: 2019-11-27 | Discharge: 2019-11-29 | DRG: 581 | Disposition: A | Discharge status: Home / Self Care | Payer: Medicaid Other | Attending: Surgery | Admitting: Surgery

## 2019-11-27 ENCOUNTER — Encounter: Payer: Self-pay | Admitting: Emergency Medicine

## 2019-11-27 ENCOUNTER — Other Ambulatory Visit: Payer: Self-pay

## 2019-11-27 DIAGNOSIS — Z79899 Other long term (current) drug therapy: Secondary | ICD-10-CM

## 2019-11-27 DIAGNOSIS — Y838 Other surgical procedures as the cause of abnormal reaction of the patient, or of later complication, without mention of misadventure at the time of the procedure: Secondary | ICD-10-CM | POA: Diagnosis present

## 2019-11-27 DIAGNOSIS — L0291 Cutaneous abscess, unspecified: Secondary | ICD-10-CM

## 2019-11-27 DIAGNOSIS — B999 Unspecified infectious disease: Secondary | ICD-10-CM | POA: Diagnosis present

## 2019-11-27 DIAGNOSIS — Z975 Presence of (intrauterine) contraceptive device: Secondary | ICD-10-CM

## 2019-11-27 DIAGNOSIS — S301XXA Contusion of abdominal wall, initial encounter: Principal | ICD-10-CM | POA: Diagnosis present

## 2019-11-27 DIAGNOSIS — Z20822 Contact with and (suspected) exposure to covid-19: Secondary | ICD-10-CM | POA: Diagnosis present

## 2019-11-27 DIAGNOSIS — M96843 Postprocedural seroma of a musculoskeletal structure following other procedure: Secondary | ICD-10-CM

## 2019-11-27 DIAGNOSIS — S3011XA Contusion of abdominal wall, initial encounter: Secondary | ICD-10-CM | POA: Diagnosis present

## 2019-11-27 DIAGNOSIS — R509 Fever, unspecified: Secondary | ICD-10-CM

## 2019-11-27 LAB — CBC
HCT: 26.9 % — ABNORMAL LOW (ref 36.0–46.0)
Hemoglobin: 9 g/dL — ABNORMAL LOW (ref 12.0–15.0)
MCH: 30.2 pg (ref 26.0–34.0)
MCHC: 33.5 g/dL (ref 30.0–36.0)
MCV: 90.3 fL (ref 80.0–100.0)
Platelets: 459 10*3/uL — ABNORMAL HIGH (ref 150–400)
RBC: 2.98 MIL/uL — ABNORMAL LOW (ref 3.87–5.11)
RDW: 13.2 % (ref 11.5–15.5)
WBC: 12.5 10*3/uL — ABNORMAL HIGH (ref 4.0–10.5)
nRBC: 0 % (ref 0.0–0.2)

## 2019-11-27 LAB — COMPREHENSIVE METABOLIC PANEL
ALT: 18 U/L (ref 0–44)
AST: 20 U/L (ref 15–41)
Albumin: 4 g/dL (ref 3.5–5.0)
Alkaline Phosphatase: 78 U/L (ref 38–126)
Anion gap: 9 (ref 5–15)
BUN: 11 mg/dL (ref 6–20)
CO2: 26 mmol/L (ref 22–32)
Calcium: 8.7 mg/dL — ABNORMAL LOW (ref 8.9–10.3)
Chloride: 101 mmol/L (ref 98–111)
Creatinine, Ser: 0.63 mg/dL (ref 0.44–1.00)
GFR calc Af Amer: >60 mL/min (ref >60)
GFR calc non Af Amer: >60 mL/min (ref >60)
Glucose, Bld: 117 mg/dL — ABNORMAL HIGH (ref 70–99)
Potassium: 4 mmol/L (ref 3.5–5.1)
Sodium: 136 mmol/L (ref 135–145)
Total Bilirubin: 1.9 mg/dL — ABNORMAL HIGH (ref 0.3–1.2)
Total Protein: 7.9 g/dL (ref 6.5–8.1)

## 2019-11-27 LAB — CBC WITH DIFFERENTIAL/PLATELET
Abs Immature Granulocytes: 0.08 10*3/uL — ABNORMAL HIGH (ref 0.00–0.07)
Basophils Absolute: 0 10*3/uL (ref 0.0–0.1)
Basophils Relative: 0 %
Eosinophils Absolute: 0 10*3/uL (ref 0.0–0.5)
Eosinophils Relative: 0 %
HCT: 27.6 % — ABNORMAL LOW (ref 36.0–46.0)
Hemoglobin: 9.7 g/dL — ABNORMAL LOW (ref 12.0–15.0)
Immature Granulocytes: 1 %
Lymphocytes Relative: 11 %
Lymphs Abs: 1.4 10*3/uL (ref 0.7–4.0)
MCH: 31.2 pg (ref 26.0–34.0)
MCHC: 35.1 g/dL (ref 30.0–36.0)
MCV: 88.7 fL (ref 80.0–100.0)
Monocytes Absolute: 1.3 10*3/uL — ABNORMAL HIGH (ref 0.1–1.0)
Monocytes Relative: 10 %
Neutro Abs: 9.8 10*3/uL — ABNORMAL HIGH (ref 1.7–7.7)
Neutrophils Relative %: 78 %
Platelets: 486 10*3/uL — ABNORMAL HIGH (ref 150–400)
RBC: 3.11 MIL/uL — ABNORMAL LOW (ref 3.87–5.11)
RDW: 13.3 % (ref 11.5–15.5)
WBC: 12.6 10*3/uL — ABNORMAL HIGH (ref 4.0–10.5)
nRBC: 0 % (ref 0.0–0.2)

## 2019-11-27 LAB — URINALYSIS, COMPLETE (UACMP) WITH MICROSCOPIC
Bilirubin Urine: NEGATIVE
Glucose, UA: NEGATIVE mg/dL
Ketones, ur: NEGATIVE mg/dL
Leukocytes,Ua: NEGATIVE
Nitrite: NEGATIVE
Protein, ur: NEGATIVE mg/dL
Specific Gravity, Urine: 1.036 — ABNORMAL HIGH (ref 1.005–1.030)
pH: 6 (ref 5.0–8.0)

## 2019-11-27 LAB — SARS CORONAVIRUS 2 BY RT PCR (HOSPITAL ORDER, PERFORMED IN ~~LOC~~ HOSPITAL LAB): SARS Coronavirus 2: NEGATIVE

## 2019-11-27 LAB — TYPE AND SCREEN
ABO/RH(D): B POS
Antibody Screen: NEGATIVE

## 2019-11-27 LAB — PROTIME-INR
INR: 1.2 (ref 0.8–1.2)
Prothrombin Time: 14.3 seconds (ref 11.4–15.2)

## 2019-11-27 LAB — LACTIC ACID, PLASMA: Lactic Acid, Venous: 0.8 mmol/L (ref 0.5–1.9)

## 2019-11-27 LAB — POCT PREGNANCY, URINE: Preg Test, Ur: NEGATIVE

## 2019-11-27 LAB — APTT: aPTT: 34 seconds (ref 24–36)

## 2019-11-27 MED ORDER — HYDROMORPHONE HCL 1 MG/ML IJ SOLN: 0.5000 mg | INTRAMUSCULAR | Status: DC | PRN

## 2019-11-27 MED ORDER — CEFAZOLIN SODIUM-DEXTROSE 2-4 GM/100ML-% IV SOLN
2.0000 g | Freq: Three times a day (TID) | INTRAVENOUS | Status: DC
  Administered 2019-11-28 – 2019-11-29 (×4): 2 g via INTRAVENOUS
  Filled 2019-11-27 (×7): qty 100

## 2019-11-27 MED ORDER — SODIUM CHLORIDE 0.9 % IV SOLN: INTRAVENOUS | Status: DC

## 2019-11-27 MED ORDER — POLYETHYLENE GLYCOL 3350 17 G PO PACK: 17.0000 g | PACK | Freq: Every day | ORAL | Status: DC | PRN

## 2019-11-27 MED ORDER — SODIUM CHLORIDE 0.9% FLUSH
3.0000 mL | Freq: Once | INTRAVENOUS | Status: AC
  Administered 2019-11-27: 3 mL via INTRAVENOUS

## 2019-11-27 MED ORDER — IOHEXOL 300 MG/ML  SOLN
100.0000 mL | Freq: Once | INTRAMUSCULAR | Status: AC | PRN
  Administered 2019-11-27: 100 mL via INTRAVENOUS

## 2019-11-27 MED ORDER — OXYCODONE HCL 5 MG PO TABS: 5.0000 mg | ORAL_TABLET | ORAL | Status: DC | PRN

## 2019-11-27 MED ORDER — ONDANSETRON 4 MG PO TBDP
4.0000 mg | ORAL_TABLET | Freq: Four times a day (QID) | ORAL | Status: DC | PRN
  Filled 2019-11-27: qty 1

## 2019-11-27 MED ORDER — CEFAZOLIN SODIUM-DEXTROSE 2-4 GM/100ML-% IV SOLN
2.0000 g | Freq: Once | INTRAVENOUS | Status: AC
  Administered 2019-11-27: 2 g via INTRAVENOUS
  Filled 2019-11-27: qty 100

## 2019-11-27 MED ORDER — ACETAMINOPHEN 500 MG PO TABS
1000.0000 mg | ORAL_TABLET | Freq: Four times a day (QID) | ORAL | Status: DC
  Administered 2019-11-28 – 2019-11-29 (×7): 1000 mg via ORAL
  Filled 2019-11-27 (×7): qty 2

## 2019-11-27 MED ORDER — ONDANSETRON HCL 4 MG/2ML IJ SOLN: 4.0000 mg | Freq: Four times a day (QID) | INTRAMUSCULAR | Status: DC | PRN

## 2019-11-27 MED ORDER — PANTOPRAZOLE SODIUM 40 MG IV SOLR
40.0000 mg | Freq: Every day | INTRAVENOUS | Status: DC
  Administered 2019-11-27 – 2019-11-28 (×2): 40 mg via INTRAVENOUS
  Filled 2019-11-27 (×2): qty 40

## 2019-11-27 NOTE — Progress Notes (Signed)
11/27/19  Patient presents to the ER tonight after having significant bleeding coming from her incision.  She's s/p excision of abdominal wall mass with Dr. Dahlia Byes on 11/13/19.  Pathology showed a desmoid tumor, negative for malignancy.  She was seen by Dr. Dahlia Byes on 6/29, at which time a subcutaneous drain was removed.  She was doing well otherwise until this afternoon.  Today, Hgb is down to 9.7 from 12.8 preop.  She has a WBC of 12.6.  After discussing with Dr. Jacqualine Code, her incision itself appears intact, but she's draining out of the prior drain site.  CT scan done which I have personally viewed and shows a large subcutaneous hematoma, without air/gas, but with surrounding edema, no active contrast extravasation noted.  Has low grade tachycardia, but with stable and normal blood pressure.  Presented with temperature of 101.1, but patient did not feel febrile prior to admission.  Will admit patient to surgery team, keep NPO as a precaution, repeat Hgb in AM, apply compression dressing with ABD pad and abdominal binder. Will hold all anticoagulation/NSAIDs and start empiric IV Ancef.  Full H&P to follow in AM.  Olean Ree, MD

## 2019-11-27 NOTE — ED Notes (Signed)
Inserted Pure Wick.

## 2019-11-27 NOTE — ED Notes (Signed)
MD states for pt to remain in bed at this time

## 2019-11-27 NOTE — ED Triage Notes (Signed)
Pt had surgery two weeks ago. She had no problems until today. She states she got up to go to the kitchen and began to bleed from her incision. Pt denies pain, fever, chills up to this time. Pt alert & oriented, nad noted.

## 2019-11-27 NOTE — ED Notes (Signed)
Pt provided clean paper scrubs due to blood all over shirt and pants. When pt stood to change, blood poured from bandage site. Pt returned to bed and this nurse applied pressure to stop bleeding. MD notified via phone call and to bedside. Placed surgiseal and pressure bandage to site. Pt in bed comfortable at this time

## 2019-11-27 NOTE — ED Provider Notes (Addendum)
[facility] Emergency Department Provider Note   ____________________________________________   First MD Initiated Contact with Patient 11/27/19 1921     (approximate)  I have reviewed the triage vital signs and the nursing notes.   HISTORY  Chief Complaint Post-op Problem  Spanish interpreter, teleservice [employer] [place] utilized  HPI Brittany Richards is a 39 y.o. female Patient reports suddenly this evening she started getting blood from below her whole in her abdomen after surgery at her surgical site  She had a lot of bleeding it was spurting blood at 1 point.  It is since stopped, but has covered her shirt and pants   She has not felt like she was having fevers or chills.  She did not feel any recent illness, she is otherwise been healing well.  She denies any pain in the abdomen.  Reports that this started shooting blood.  Had surgery just recently by Dr. Dahlia Byes   Past Medical History:  Diagnosis Date  . Anemia     There are no problems to display for this patient.   Past Surgical History:  Procedure Laterality Date  . EXCISION OF ABDOMINAL WALL TUMOR N/A 11/13/2019   Procedure: EXCISION OF ABDOMINAL WALL TUMOR;  Surgeon: Jules Husbands, MD;  Location: [facility] ORS;  Service: General;  Laterality: N/A;    Prior to Admission medications   Medication Sig Start Date End Date Taking? Authorizing Provider  ferrous sulfate 325 (65 FE) MG tablet Take 325 mg by mouth daily with breakfast.   Yes [provider]  HYDROcodone-acetaminophen (NORCO/VICODIN) 5-325 MG tablet Take 1-2 tablets by mouth every 4 (four) hours as needed for moderate pain. Patient not taking: Reported on 11/27/2019 11/13/19   Jules Husbands, MD  levonorgestrel (MIRENA) 20 MCG/24HR IUD 1 each by Intrauterine route once.    [provider]    Allergies Patient has no known allergies.  No family history on file.  Social History Social History   Tobacco Use  .  Smoking status: Never Smoker  . Smokeless tobacco: Never Used  Vaping Use  . Vaping Use: Never used  Substance Use Topics  . Alcohol use: No  . Drug use: No    Review of Systems Constitutional: No fever/chills Cardiovascular: Denies chest pain. Respiratory: Denies shortness of breath. Gastrointestinal: No abdominal pain.  See HPI Genitourinary: Negative for dysuria. Musculoskeletal: Negative for back pain. Skin: Negative for rash. Neurological: Negative for headaches, areas of focal weakness or numbness.    ____________________________________________   PHYSICAL EXAM:  VITAL SIGNS: ED Triage Vitals  Enc Vitals Group     BP 11/27/19 1901 111/71     Pulse Rate 11/27/19 1901 (!) 115     Resp 11/27/19 1901 20     Temp 11/27/19 1901 (!) 101.1 F (38.4 C)     Temp Source 11/27/19 1901 Oral     SpO2 11/27/19 1901 98 %     Weight 11/27/19 1902 132 lb (59.9 kg)     Height 11/27/19 1902 5' 3.39" (1.61 m)     Head Circumference --      Peak Flow --      Pain Score 11/27/19 1902 3     Pain Loc --      Pain Edu? --      Excl. in [place]? --     Constitutional: Alert and oriented. Well appearing and in no acute distress.  She does however have notably bloodsoaked T-shirt and left pant leg Eyes: Conjunctivae  are normal. Head: Atraumatic. Nose: No congestion/rhinnorhea. Mouth/Throat: Mucous membranes are moist. Neck: No stridor.  Cardiovascular: Normal rate, regular rhythm. Grossly normal heart sounds.  Good peripheral circulation. Respiratory: Normal respiratory effort.  No retractions. Lungs CTAB. Gastrointestinal: Soft and nontender. No distention.  She does have some induration or swelling mild over the left side of the abdomen with bruising and ecchymosis over the left flank.  Additionally she has a large incision that appears to be healing well without purulence or drainage, at her left lower abdomen trocar or drain site however she had a small clot overlying it.  This did come  off at one point and began a small amount of spurting of red blood, Surgicel and abdominal dressing placed over this with good control. Musculoskeletal: No lower extremity tenderness nor edema. Neurologic:  Normal speech and language. No gross focal neurologic deficits are appreciated.  Skin:  Skin is warm, dry and intact. No rash noted. Psychiatric: Mood and affect are normal. Speech and behavior are normal.  ____________________________________________   LABS (all labs ordered are listed, but only abnormal results are displayed)  Labs Reviewed  COMPREHENSIVE METABOLIC PANEL - Abnormal; Notable for the following components:      Result Value   Glucose, Bld 117 (*)    Calcium 8.7 (*)    Total Bilirubin 1.9 (*)    All other components within normal limits  CBC WITH DIFFERENTIAL/PLATELET - Abnormal; Notable for the following components:   WBC 12.6 (*)    RBC 3.11 (*)    Hemoglobin 9.7 (*)    HCT 27.6 (*)    Platelets 486 (*)    Neutro Abs 9.8 (*)    Monocytes Absolute 1.3 (*)    Abs Immature Granulocytes 0.08 (*)    All other components within normal limits  SARS CORONAVIRUS 2 BY RT PCR (HOSPITAL ORDER, [place] [facility])  CULTURE, BLOOD (ROUTINE X 2)  CULTURE, BLOOD (ROUTINE X 2)  LACTIC ACID, PLASMA  APTT  PROTIME-INR  LACTIC ACID, PLASMA  URINALYSIS, COMPLETE (UACMP) WITH MICROSCOPIC  POC URINE PREG, ED  TYPE AND SCREEN   ____________________________________________  EKG   ____________________________________________  RADIOLOGY  CT ABDOMEN PELVIS W CONTRAST  Result Date: 11/27/2019 CLINICAL DATA:  39 year old female with abdominal pain. Status post excision of left abdominal wall tumor. Postoperative fever. EXAM: CT ABDOMEN AND PELVIS WITH CONTRAST TECHNIQUE: Multidetector CT imaging of the abdomen and pelvis was performed using the standard protocol following bolus administration of intravenous contrast. CONTRAST:  165mL OMNIPAQUE IOHEXOL 300  MG/ML  SOLN COMPARISON:  Abdominal CT dated 10/13/2019. FINDINGS: Lower chest: The visualized lung bases are clear. No intra-abdominal free air. Trace free fluid within the pelvis, likely physiologic. Hepatobiliary: Subcentimeter right hepatic hypodense focus is too small to characterize. The liver is otherwise unremarkable. No intrahepatic biliary ductal dilatation. Small stones noted within the gallbladder. No pericholecystic fluid or evidence of acute cholecystitis by CT. Pancreas: Unremarkable. No pancreatic ductal dilatation or surrounding inflammatory changes. Spleen: Normal in size without focal abnormality. Adrenals/Urinary Tract: The adrenal glands unremarkable. The kidneys, visualized ureters, and urinary bladder appear unremarkable. Stomach/Bowel: There is no bowel obstruction or active inflammation. The appendix is normal. Vascular/Lymphatic: The abdominal aorta and IVC unremarkable. No portal venous gas. There is no adenopathy. Reproductive: The uterus is anteverted. An intrauterine device is noted. Evaluation of the IUD is limited on CT. However, the IUD appears to extend into the fundal myometrium concerning for myometrial penetration. Dedicated ultrasound is recommended  to evaluate for positioning of the IUD on a nonemergent/outpatient basis. The ovaries are unremarkable. Other: There is a 15 x 4.3 x 18 cm loculated collection with surrounding edema in the subcutaneous soft tissues of the left anterior abdominal wall superficial to the abdominal musculature which may represent a subacute or old hematoma or an infected collection/abscess. Musculoskeletal: No acute or significant osseous findings. IMPRESSION: 1. A large left anterior abdominal wall hematoma or infected collection/abscess. 2. Cholelithiasis. 3. No bowel obstruction. Normal appendix. 4. An intrauterine device with findings concerning for penetration into the fundal myometrium. Dedicated ultrasound is recommended to evaluate for  positioning of the IUD on a nonemergent/outpatient basis. Electronically Signed   By: Anner Crete M.D.   On: 11/27/2019 21:08   DG Chest Port 1 View  Result Date: 11/27/2019 CLINICAL DATA:  Recent bleeding from abdominal incision related to prior mass excision. EXAM: PORTABLE CHEST 1 VIEW COMPARISON:  06/08/2018 FINDINGS: The heart size and mediastinal contours are within normal limits. Both lungs are clear. The visualized skeletal structures are unremarkable. IMPRESSION: No active disease. Electronically Signed   By: Inez Catalina M.D.   On: 11/27/2019 19:34    Imaging studies including CT scan discussed closely with Dr. Hampton Abbot ____________________________________________   PROCEDURES  Procedure(s) performed: None  Procedures  Critical Care performed: No  ____________________________________________   INITIAL IMPRESSION / ASSESSMENT AND PLAN / ED COURSE  Pertinent labs & imaging results that were available during my care of the patient were reviewed by me and considered in my medical decision making (see chart for details).   Discussed case with Dr. Hampton Abbot.  Will see in consult.  He recommends obtaining CT scan.  Patient slightly tachycardic, will give fluid bolus.  Mild drop in hemoglobin.  Type and screen sent being observed very closely without evidence of further bleeding after bandaging.  Also noted to be febrile.  Elevated concern for possible abscess, acute intra-abdominal process, hematoma, bleeding etc.  Denies any pulmonary neurologic or cardiac symptoms.  Clinical Course as of Nov 26 2298  Tue Nov 27, 2019  2132 Discussed clinical presentation, bleeding site, history, fever, and imaging studies with general surgery Dr. Hampton Abbot.  Advises will admit to his service this evening   [MQ]  2241 Updated patient and her   [MQ]    Clinical Course User Index [MQ] Delman Kitten, MD   ----------------------------------------- 9:31 PM on  11/27/2019 -----------------------------------------  CT imaging reviewed with Dr. Programmer, systems.  He will be admitting her to the general surgery service for examination and serial hemoglobin monitoring.  ____________________________________________   FINAL CLINICAL IMPRESSION(S) / ED DIAGNOSES  Final diagnoses:  Abdominal wall hematoma, initial encounter  Fever, unspecified fever cause        Note:  This document was prepared using Dragon voice recognition software and may include unintentional dictation errors       Delman Kitten, MD 11/27/19 2300    Delman Kitten, MD 11/27/19 2330

## 2019-11-27 NOTE — ED Notes (Signed)
MD states pt to CT now, no need for POC preg. CT called and informed

## 2019-11-28 ENCOUNTER — Inpatient Hospital Stay: Payer: Medicaid Other

## 2019-11-28 DIAGNOSIS — M96843 Postprocedural seroma of a musculoskeletal structure following other procedure: Secondary | ICD-10-CM

## 2019-11-28 LAB — BASIC METABOLIC PANEL
Anion gap: 11 (ref 5–15)
BUN: 9 mg/dL (ref 6–20)
CO2: 24 mmol/L (ref 22–32)
Calcium: 9 mg/dL (ref 8.9–10.3)
Chloride: 103 mmol/L (ref 98–111)
Creatinine, Ser: 0.49 mg/dL (ref 0.44–1.00)
GFR calc Af Amer: >60 mL/min (ref >60)
GFR calc non Af Amer: >60 mL/min (ref >60)
Glucose, Bld: 98 mg/dL (ref 70–99)
Potassium: 3.8 mmol/L (ref 3.5–5.1)
Sodium: 138 mmol/L (ref 135–145)

## 2019-11-28 LAB — CBC WITH DIFFERENTIAL/PLATELET
Abs Immature Granulocytes: 0.05 10*3/uL (ref 0.00–0.07)
Basophils Absolute: 0 10*3/uL (ref 0.0–0.1)
Basophils Relative: 0 %
Eosinophils Absolute: 0.1 10*3/uL (ref 0.0–0.5)
Eosinophils Relative: 1 %
HCT: 27.4 % — ABNORMAL LOW (ref 36.0–46.0)
Hemoglobin: 9.1 g/dL — ABNORMAL LOW (ref 12.0–15.0)
Immature Granulocytes: 1 %
Lymphocytes Relative: 19 %
Lymphs Abs: 2 10*3/uL (ref 0.7–4.0)
MCH: 30.2 pg (ref 26.0–34.0)
MCHC: 33.2 g/dL (ref 30.0–36.0)
MCV: 91 fL (ref 80.0–100.0)
Monocytes Absolute: 1.2 10*3/uL — ABNORMAL HIGH (ref 0.1–1.0)
Monocytes Relative: 12 %
Neutro Abs: 6.8 10*3/uL (ref 1.7–7.7)
Neutrophils Relative %: 67 %
Platelets: 450 10*3/uL — ABNORMAL HIGH (ref 150–400)
RBC: 3.01 MIL/uL — ABNORMAL LOW (ref 3.87–5.11)
RDW: 13.3 % (ref 11.5–15.5)
WBC: 10.2 10*3/uL (ref 4.0–10.5)
nRBC: 0 % (ref 0.0–0.2)

## 2019-11-28 LAB — MAGNESIUM: Magnesium: 2.2 mg/dL (ref 1.7–2.4)

## 2019-11-28 LAB — HIV ANTIBODY (ROUTINE TESTING W REFLEX): HIV Screen 4th Generation wRfx: NONREACTIVE

## 2019-11-28 MED ORDER — SODIUM CHLORIDE 0.9% FLUSH
5.0000 mL | Freq: Three times a day (TID) | INTRAVENOUS | Status: DC
  Administered 2019-11-28 – 2019-11-29 (×4): 5 mL

## 2019-11-28 MED ORDER — FENTANYL CITRATE (PF) 100 MCG/2ML IJ SOLN
INTRAMUSCULAR | Status: DC | PRN
  Administered 2019-11-28: 50 ug via INTRAVENOUS

## 2019-11-28 MED ORDER — MIDAZOLAM HCL 2 MG/2ML IJ SOLN
INTRAMUSCULAR | Status: AC
  Filled 2019-11-28: qty 2

## 2019-11-28 MED ORDER — MIDAZOLAM HCL 2 MG/2ML IJ SOLN
INTRAMUSCULAR | Status: DC | PRN
  Administered 2019-11-28: 1 mg via INTRAVENOUS

## 2019-11-28 MED ORDER — FENTANYL CITRATE (PF) 100 MCG/2ML IJ SOLN
INTRAMUSCULAR | Status: AC
  Filled 2019-11-28: qty 2

## 2019-11-28 NOTE — ED Notes (Signed)
Using Interpretor, this nurse educated pt and her husband on plan of care and NPO status. Pt and husband report understanding of information and have no further questions at this time. Pt in bed, reports comfort, and call light in reach. Husband at bedside

## 2019-11-28 NOTE — H&P (Signed)
[facility] SURGICAL HISTORY & PHYSICAL (cpt [phone])  HISTORY OF PRESENT ILLNESS (HPI):  39 y.o. female presented to [facility] ED yesterday for a post-op problem. She is 15 days s/p excision of abdominal wall desmoid tumor with Dr Dahlia Byes. She was seen in follow up on 06/30 and was doing well, drain was removed at that time. Patient reports that suddenly yesterday she started to have blood coming from the previous drain site. She described this as "blood spurting out," which saturated her clothing. This did stop prior to presenting to the ED. No associated symptoms with this. No issues with her incision site. Otherwise no complaints. Work up in the ED was concerning for a drop in her Hgb to 9.7 (from 12.8 per-operative) and has trended 9.0 --> 9.1 since admission. All other labs are reassuring.   General surgery is consulted by emergency medicine physician Dr Delman Kitten, MD for evaluation and management of abdominal wall hematoma 15 days s/p excision of abdominal wall tumor    PAST MEDICAL HISTORY (PMH):  Past Medical History:  Diagnosis Date  . Anemia     Reviewed. Otherwise negative.   PAST SURGICAL HISTORY ([place]):  Past Surgical History:  Procedure Laterality Date  . EXCISION OF ABDOMINAL WALL TUMOR N/A 11/13/2019   Procedure: EXCISION OF ABDOMINAL WALL TUMOR;  Surgeon: Jules Husbands, MD;  Location: [facility] ORS;  Service: General;  Laterality: N/A;    Reviewed. Otherwise negative.   MEDICATIONS:  Prior to Admission medications   Medication Sig Start Date End Date Taking? Authorizing Provider  ferrous sulfate 325 (65 FE) MG tablet Take 325 mg by mouth daily with breakfast.   Yes [provider]  HYDROcodone-acetaminophen (NORCO/VICODIN) 5-325 MG tablet Take 1-2 tablets by mouth every 4 (four) hours as needed for moderate pain. Patient not taking: Reported on 11/27/2019 11/13/19   Jules Husbands, MD  levonorgestrel (MIRENA) 20 MCG/24HR IUD 1 each by Intrauterine route once.     [provider]     ALLERGIES:  No Known Allergies   SOCIAL HISTORY:  Social History   Socioeconomic History  . Marital status: Single    Spouse name: Not on file  . Number of children: Not on file  . Years of education: Not on file  . Highest education level: Not on file  Occupational History  . Not on file  Tobacco Use  . Smoking status: Never Smoker  . Smokeless tobacco: Never Used  Vaping Use  . Vaping Use: Never used  Substance and Sexual Activity  . Alcohol use: No  . Drug use: No  . Sexual activity: Yes  Other Topics Concern  . Not on file  Social History Narrative  . Not on file   Social Determinants of Health   Financial Resource Strain:   . Difficulty of Paying Living Expenses:   Food Insecurity:   . Worried About Charity fundraiser in the Last Year:   . Arboriculturist in the Last Year:   Transportation Needs:   . Film/video editor (Medical):   Marland Kitchen Lack of Transportation (Non-Medical):   Physical Activity:   . Days of Exercise per Week:   . Minutes of Exercise per Session:   Stress:   . Feeling of Stress :   Social Connections:   . Frequency of Communication with Friends and Family:   . Frequency of Social Gatherings with Friends and Family:   . Attends Religious Services:   . Active Member of  Clubs or Organizations:   . Attends Archivist Meetings:   Marland Kitchen Marital Status:   Intimate Partner Violence:   . Fear of Current or Ex-Partner:   . Emotionally Abused:   Marland Kitchen Physically Abused:   . Sexually Abused:      FAMILY HISTORY:  No family history on file.  Otherwise negative.   REVIEW OF SYSTEMS:  Review of Systems  Constitutional: Negative for chills and fever.  Respiratory: Negative for cough and shortness of breath.   Cardiovascular: Negative for chest pain and palpitations.  Gastrointestinal: Negative for abdominal pain, nausea and vomiting.  Genitourinary: Negative for dysuria and urgency.  Skin:       + Post Op  Wound Drainage   All other systems reviewed and are negative.   VITAL SIGNS:  Temp:  [98.3 F (36.8 C)-101.1 F (38.4 C)] 98.6 F (37 C) (07/07 0302) Pulse Rate:  [77-115] 90 (07/07 0302) Resp:  [14-22] 18 (07/07 0302) BP: (97-127)/(61-81) 127/77 (07/07 0302) SpO2:  [97 %-100 %] 100 % (07/07 0302) Weight:  [59.9 kg] 59.9 kg (07/06 1902)     Height: 5' 3.39" (161 cm) Weight: 59.9 kg BMI (Calculated): 23.1   PHYSICAL EXAM:  Physical Exam Vitals and nursing note reviewed. Exam conducted with a chaperone present.  Constitutional:      General: She is not in acute distress.    Appearance: Normal appearance. She is normal weight. She is not ill-appearing.  HENT:     Head: Normocephalic and atraumatic.  Eyes:     General: No scleral icterus.    Conjunctiva/sclera: Conjunctivae normal.  Cardiovascular:     Rate and Rhythm: Normal rate and regular rhythm.     Pulses: Normal pulses.     Heart sounds: No murmur heard.   Pulmonary:     Effort: Pulmonary effort is normal. No respiratory distress.  Abdominal:     General: A surgical scar is present. There is no distension.     Palpations: Abdomen is soft.     Tenderness: There is no abdominal tenderness. There is no guarding or rebound.    Genitourinary:    Comments: Deferred Musculoskeletal:        General: Normal range of motion.     Right lower leg: No edema.     Left lower leg: No edema.  Skin:    General: Skin is warm and dry.     Coloration: Skin is not pale.  Neurological:     General: No focal deficit present.     Mental Status: She is alert and oriented to person, place, and time.  Psychiatric:        Mood and Affect: Mood normal.        Behavior: Behavior normal.     INTAKE/OUTPUT:  This shift: No intake/output data recorded.  Last 2 shifts: @IOLAST2SHIFTS @  Labs:  CBC Latest Ref Rng & Units 11/28/2019 11/27/2019 11/27/2019  WBC 4.0 - 10.5 K/uL 10.2 12.5(H) 12.6(H)  Hemoglobin 12.0 - 15.0 g/dL 9.1(L) 9.0(L)  9.7(L)  Hematocrit 36 - 46 % 27.4(L) 26.9(L) 27.6(L)  Platelets 150 - 400 K/uL 450(H) 459(H) 486(H)   CMP Latest Ref Rng & Units 11/28/2019 11/27/2019 10/13/2019  Glucose 70 - 99 mg/dL 98 117(H) 92  BUN 6 - 20 mg/dL 9 11 12   Creatinine 0.44 - 1.00 mg/dL 0.49 0.63 0.44  Sodium 135 - 145 mmol/L 138 136 136  Potassium 3.5 - 5.1 mmol/L 3.8 4.0 3.5  Chloride 98 - 111 mmol/L 103  101 103  CO2 22 - 32 mmol/L 24 26 22   Calcium 8.9 - 10.3 mg/dL 9.0 8.7(L) 9.0  Total Protein 6.5 - 8.1 g/dL - 7.9 8.7(H)  Total Bilirubin 0.3 - 1.2 mg/dL - 1.9(H) 0.7  Alkaline Phos 38 - 126 U/L - 78 90  AST 15 - 41 U/L - 20 17  ALT 0 - 44 U/L - 18 15     Imaging studies:   CT Abdomen/Pelvis (11/27/2019) personally reviewed which shows a large left sided abdominal wall hematoma without contrast extravasation, and radiologist report reviewed:  IMPRESSION: 1. A large left anterior abdominal wall hematoma or infected collection/abscess. 2. Cholelithiasis. 3. No bowel obstruction. Normal appendix. 4. An intrauterine device with findings concerning for penetration into the fundal myometrium. Dedicated ultrasound is recommended to evaluate for positioning of the IUD on a nonemergent/outpatient basis.   Assessment/Plan: (ICD-10's: M96.843) 39 y.o. female with likely post-operative seroma vs hematoma 15 days s/p excision of abdominal wall tumor    - Will plan to discuss with IR whether or not they felt this fluid collection was amenable to drainage  - Continue NPO + IVF resuscitation    - Pain control (hold NSAIDs); antiemetics prn  - Continue abdominal binder  - Monitor abdominal examination  - No emergent intervention  - Monitor hemoglobin  - DVT prophylaxis; HOLD  All of the above findings and recommendations were discussed with the patient, and all of her questions were answered to her expressed satisfaction.  -- Edison Simon, PA-C [facility] 11/28/2019, 7:46 AM [phone] M-F: 7am  - 4pm

## 2019-11-28 NOTE — Procedures (Signed)
Pre procedural Dx: Post op abdominal wall fluid collection Post procedural Dx: Same  Technically successful CT guided placed of a 10 Fr drainage catheter placement into the large ventral wall fluid collection yielding 350 cc of dark red fluid.    A representative aspirated sample was capped and sent to the laboratory for analysis.    EBL: None Complications: None immediate  Ronny Bacon, MD Pager #: [phone]

## 2019-11-28 NOTE — Plan of Care (Signed)
  Problem: Education: Goal: Knowledge of General Education information will improve Description Including pain rating scale, medication(s)/side effects and non-pharmacologic comfort measures Outcome: Progressing   

## 2019-11-28 NOTE — Consult Note (Signed)
Chief Complaint: Post-op abdominal wall fluid collection  Referring Physician(s): Pabon  Patient Status: [facility] - Out-pt  History of Present Illness: Brittany Richards is a 39 y.o. female with past medical history for left abdominal wall desmoid tumor subsequent CT scan of the abdomen and pelvis demonstrated a large fluid collection along the ventral aspect of the left abdominal wall.  As such, request made for image guided placement of a drainage catheter for source control purposes.  Patient continues to complain of discomfort at the operative site.  She is otherwise without complaint.  Specifically, no fever or chills.  Past Medical History:  Diagnosis Date  . Anemia     Past Surgical History:  Procedure Laterality Date  . EXCISION OF ABDOMINAL WALL TUMOR N/A 11/13/2019   Procedure: EXCISION OF ABDOMINAL WALL TUMOR;  Surgeon: Jules Husbands, MD;  Location: [facility] ORS;  Service: General;  Laterality: N/A;    Allergies: Patient has no known allergies.  Medications: Prior to Admission medications   Medication Sig Start Date End Date Taking? Authorizing Provider  ferrous sulfate 325 (65 FE) MG tablet Take 325 mg by mouth daily with breakfast.   Yes [provider]  HYDROcodone-acetaminophen (NORCO/VICODIN) 5-325 MG tablet Take 1-2 tablets by mouth every 4 (four) hours as needed for moderate pain. Patient not taking: Reported on 11/27/2019 11/13/19   Jules Husbands, MD  levonorgestrel (MIRENA) 20 MCG/24HR IUD 1 each by Intrauterine route once.    [provider]     No family history on file.  Social History   Socioeconomic History  . Marital status: Single    Spouse name: Not on file  . Number of children: Not on file  . Years of education: Not on file  . Highest education level: Not on file  Occupational History  . Not on file  Tobacco Use  . Smoking status: Never Smoker  . Smokeless tobacco: Never Used  Vaping Use  . Vaping Use: Never used    Substance and Sexual Activity  . Alcohol use: No  . Drug use: No  . Sexual activity: Yes  Other Topics Concern  . Not on file  Social History Narrative  . Not on file   Social Determinants of Health   Financial Resource Strain:   . Difficulty of Paying Living Expenses:   Food Insecurity:   . Worried About Charity fundraiser in the Last Year:   . Arboriculturist in the Last Year:   Transportation Needs:   . Film/video editor (Medical):   Marland Kitchen Lack of Transportation (Non-Medical):   Physical Activity:   . Days of Exercise per Week:   . Minutes of Exercise per Session:   Stress:   . Feeling of Stress :   Social Connections:   . Frequency of Communication with Friends and Family:   . Frequency of Social Gatherings with Friends and Family:   . Attends Religious Services:   . Active Member of Clubs or Organizations:   . Attends Archivist Meetings:   Marland Kitchen Marital Status:     ECOG Status: 1 - Symptomatic but completely ambulatory  Review of Systems: A 12 point ROS discussed and pertinent positives are indicated in the HPI above.  All other systems are negative.  Review of Systems  Vital Signs: BP 110/64 (BP Location: Left Arm)   Pulse 95   Temp 98.7 F (37.1 C) (Oral)   Resp 19   Ht 5' 3.39" (1.61  m)   Wt 59.9 kg   SpO2 99%   BMI 23.10 kg/m   Physical Exam  Imaging: MR Abdomen W Wo Contrast  Result Date: 11/06/2019 CLINICAL DATA:  Left anterior abdominal wall mass. EXAM: MRI ABDOMEN WITHOUT AND WITH CONTRAST TECHNIQUE: Multiplanar multisequence MR imaging of the abdomen was performed both before and after the administration of intravenous contrast. CONTRAST:  1mL GADAVIST GADOBUTROL 1 MMOL/ML IV SOLN COMPARISON:  Ultrasound and CT abdomen dated Oct 13, 2019. FINDINGS: Within the left external oblique muscle, there is an oval, well-defined predominantly T2 hyperintense, T1 isointense, heterogeneously enhancing mass measuring 2.5 x 1.5 x 4.4 cm, previously  2.7 x 1.4 x 4.5 cm on CT (by my measurements). Mass demonstrates few streaky areas of low T2 signal. There is no surrounding soft tissue edema. The visualized intra-abdominal contents are unremarkable. IMPRESSION: 1. 4.4 cm solid mass in the left external oblique muscle. Although the imaging appearance is nonspecific, this most likely represents a desmoid tumor given the patient's age and gender. Confirmation with tissue sampling is recommended to exclude the unlikely possibility of a sarcoma. Electronically Signed   By: Titus Dubin M.D.   On: 11/06/2019 11:40   CT ABDOMEN PELVIS W CONTRAST  Result Date: 11/27/2019 CLINICAL DATA:  39 year old female with abdominal pain. Status post excision of left abdominal wall tumor. Postoperative fever. EXAM: CT ABDOMEN AND PELVIS WITH CONTRAST TECHNIQUE: Multidetector CT imaging of the abdomen and pelvis was performed using the standard protocol following bolus administration of intravenous contrast. CONTRAST:  186mL OMNIPAQUE IOHEXOL 300 MG/ML  SOLN COMPARISON:  Abdominal CT dated 10/13/2019. FINDINGS: Lower chest: The visualized lung bases are clear. No intra-abdominal free air. Trace free fluid within the pelvis, likely physiologic. Hepatobiliary: Subcentimeter right hepatic hypodense focus is too small to characterize. The liver is otherwise unremarkable. No intrahepatic biliary ductal dilatation. Small stones noted within the gallbladder. No pericholecystic fluid or evidence of acute cholecystitis by CT. Pancreas: Unremarkable. No pancreatic ductal dilatation or surrounding inflammatory changes. Spleen: Normal in size without focal abnormality. Adrenals/Urinary Tract: The adrenal glands unremarkable. The kidneys, visualized ureters, and urinary bladder appear unremarkable. Stomach/Bowel: There is no bowel obstruction or active inflammation. The appendix is normal. Vascular/Lymphatic: The abdominal aorta and IVC unremarkable. No portal venous gas. There is no  adenopathy. Reproductive: The uterus is anteverted. An intrauterine device is noted. Evaluation of the IUD is limited on CT. However, the IUD appears to extend into the fundal myometrium concerning for myometrial penetration. Dedicated ultrasound is recommended to evaluate for positioning of the IUD on a nonemergent/outpatient basis. The ovaries are unremarkable. Other: There is a 15 x 4.3 x 18 cm loculated collection with surrounding edema in the subcutaneous soft tissues of the left anterior abdominal wall superficial to the abdominal musculature which may represent a subacute or old hematoma or an infected collection/abscess. Musculoskeletal: No acute or significant osseous findings. IMPRESSION: 1. A large left anterior abdominal wall hematoma or infected collection/abscess. 2. Cholelithiasis. 3. No bowel obstruction. Normal appendix. 4. An intrauterine device with findings concerning for penetration into the fundal myometrium. Dedicated ultrasound is recommended to evaluate for positioning of the IUD on a nonemergent/outpatient basis. Electronically Signed   By: Anner Crete M.D.   On: 11/27/2019 21:08   DG Chest Port 1 View  Result Date: 11/27/2019 CLINICAL DATA:  Recent bleeding from abdominal incision related to prior mass excision. EXAM: PORTABLE CHEST 1 VIEW COMPARISON:  06/08/2018 FINDINGS: The heart size and mediastinal contours are within normal  limits. Both lungs are clear. The visualized skeletal structures are unremarkable. IMPRESSION: No active disease. Electronically Signed   By: Inez Catalina M.D.   On: 11/27/2019 19:34    Labs:  CBC: Recent Labs    10/13/19 1525 11/27/19 1912 11/27/19 2234 11/28/19 0533  WBC 10.4 12.6* 12.5* 10.2  HGB 12.8 9.7* 9.0* 9.1*  HCT 37.2 27.6* 26.9* 27.4*  PLT 360 486* 459* 450*    COAGS: Recent Labs    11/27/19 1945  INR 1.2  APTT 34    BMP: Recent Labs    10/13/19 1525 11/27/19 1912 11/28/19 0533  NA 136 136 138  K 3.5 4.0 3.8    CL 103 101 103  CO2 22 26 24   GLUCOSE 92 117* 98  BUN 12 11 9   CALCIUM 9.0 8.7* 9.0  CREATININE 0.44 0.63 0.49  GFRNONAA >60 >60 >60  GFRAA >60 >60 >60    LIVER FUNCTION TESTS: Recent Labs    10/13/19 1525 11/27/19 1912  BILITOT 0.7 1.9*  AST 17 20  ALT 15 18  ALKPHOS 90 78  PROT 8.7* 7.9  ALBUMIN 4.8 4.0    TUMOR MARKERS: No results for input(s): AFPTM, CEA, CA199, CHROMGRNA in the last 8760 hours.  Assessment and Plan:  Brittany Richards is a 39 y.o. female with past medical history for left abdominal wall desmoid tumor subsequent CT scan of the abdomen and pelvis demonstrated a large fluid collection along the ventral aspect of the left abdominal wall.  As such, request made for image guided placement of a drainage catheter for source control purposes.  Risks and benefits of image guided drain placement was discussed with the patient (via the use of a medical translator) including bleeding, infection and/or damage to adjacent structures.  All of the patient's questions were answered, patient is agreeable to proceed. Consent signed and in chart.  Thank you for this interesting consult.  I greatly enjoyed meeting Brittany Richards and look forward to participating in their care.  A copy of this report was sent to the requesting provider on this date.  Electronically Signed: Sandi Mariscal, MD 11/28/2019, 11:13 AM   I spent a total of 20 Minutes in face to face in clinical consultation, greater than 50% of which was counseling/coordinating care for image guided drainage catheter placement

## 2019-11-28 NOTE — Progress Notes (Signed)
Patient clinically stable post Drain placement per Dr. Pascal Lux, tolerated well, awake/alert and oriented post procedure. Denies complaints at this time. Report given to Bet Rn on 2C with questions answered.received Versed 1mg  along with Fentanyl 52mcg Iv for procedure.

## 2019-11-29 LAB — CBC
HCT: 26.9 % — ABNORMAL LOW (ref 36.0–46.0)
Hemoglobin: 8.9 g/dL — ABNORMAL LOW (ref 12.0–15.0)
MCH: 30.1 pg (ref 26.0–34.0)
MCHC: 33.1 g/dL (ref 30.0–36.0)
MCV: 90.9 fL (ref 80.0–100.0)
Platelets: 436 10*3/uL — ABNORMAL HIGH (ref 150–400)
RBC: 2.96 MIL/uL — ABNORMAL LOW (ref 3.87–5.11)
RDW: 13.4 % (ref 11.5–15.5)
WBC: 6.4 10*3/uL (ref 4.0–10.5)
nRBC: 0 % (ref 0.0–0.2)

## 2019-11-29 NOTE — Discharge Summary (Signed)
[facility] SURGICAL DISCHARGE SUMMARY  Patient ID: Brittany Richards MRN: [id number] DOB/AGE: 41-Jun-1982 41 y.o.  Admit date: 11/27/2019 Discharge date: 11/29/2019  Discharge Diagnoses Patient Active Problem List   Diagnosis Date Noted   Hematoma of abdominal wall 11/27/2019    Consultants Interventional Radiology  Procedures 11/28/2019 - Dr Pascal Lux 1) CT guided placed of a 10 Fr drainage catheter placement   HPI: 39 y.o. female presented to [facility] ED yesterday for a post-op problem. She is 15 days s/p excision of abdominal wall desmoid tumor with Dr Dahlia Byes. She was seen in follow up on 06/30 and was doing well, drain was removed at that time. Patient reports that suddenly yesterday she started to have blood coming from the previous drain site. She described this as "blood spurting out," which saturated her clothing. This did stop prior to presenting to the ED. No associated symptoms with this. No issues with her incision site. Otherwise no complaints. Work up in the ED was concerning for a drop in her Hgb to 9.7 (from 12.8 per-operative) and has trended 9.0 --> 9.1 since admission. All other labs are reassuring.   Hospital Course: Patient was admitted to the general surgery service for work up of this. She underwent CT Guided drainage with IR on 07/07 and revealed serosanguinous/Old Bloody out put concerning for Liquifed hematoma vs seroma. There was no sign of abscess/purulence/infection. She did well following this procedure. The remainder of patient's hospital course was essentially unremarkable, and discharge planning was initiated accordingly with patient safely able to be discharged home with appropriate discharge instructions, pain control, and outpatient follow-up after all of her questions were answered to her expressed satisfaction.   Discharge Condition: Good    Allergies as of 11/29/2019   No Known Allergies     Medication List    TAKE these medications    ferrous sulfate 325 (65 FE) MG tablet Take 325 mg by mouth daily with breakfast.   HYDROcodone-acetaminophen 5-325 MG tablet Commonly known as: NORCO/VICODIN Take 1-2 tablets by mouth every 4 (four) hours as needed for moderate pain.   levonorgestrel 20 MCG/24HR IUD Commonly known as: MIRENA 1 each by Intrauterine route once.         [place], [employer] Follow up.   Contact information: [place] [address] [phone]        Caroleen Hamman F, MD. Schedule an appointment as soon as possible for a visit in 1 week(s).   Specialty: General Surgery Why: Post op abdominal wall hematoma s/p drain  Contact information: [address] [place] [address] [phone]                Time spent on discharge management including discussion of hospital course, clinical condition, outpatient instructions, prescriptions, and follow up with the patient and members of the medical team: >30 minutes  -- Edison Simon , PA-C [facility]  11/29/2019, 12:43 PM [phone] M-F: 7am - 4pm

## 2019-11-29 NOTE — Progress Notes (Signed)
[place] Hospital Day(s): 2.   Interval History:  Patient seen and examined no acute events or new complaints overnight.  Patient reports she feels okay this morning, no complaints of pain. She did have issues with leaking around her drain yesterday but that has subsided No fever, chills, nausea, or emesis She did undergo percutaneous drainage of abdominal wall collection yesterday with IR, she has had about 140 ccs out in last 24 hours, serosanguinous  She is on regular diet    Vital signs in last 24 hours: [min-max] current  Temp:  [98 F (36.7 C)-98.7 F (37.1 C)] 98.2 F (36.8 C) (07/08 0406) Pulse Rate:  [76-95] 82 (07/08 0406) Resp:  [11-20] 18 (07/08 0406) BP: (100-133)/(43-71) 133/61 (07/08 0406) SpO2:  [95 %-100 %] 100 % (07/08 0406)     Height: 5' 3.39" (161 cm) Weight: 59.9 kg BMI (Calculated): 23.1   Intake/Output last 2 shifts:  07/07 0701 - 07/08 0700 In: 1509 [I.V.:1209; IV Piggyback:300] Out: 140 [Drains:140]   Physical Exam:  Constitutional: alert, cooperative and no distress  Respiratory: breathing non-labored at rest  Cardiovascular: regular rate and sinus rhythm  Gastrointestinal: Soft, non-tender, and non-distended. No rebound/guarding. JP in the left abdomen with serosanguinous drainage and some old blood Integumentary: Incision over left lateral abdomen is healing well with dermabond, some ecchymosis, no erythema   Labs:  CBC Latest Ref Rng & Units 11/28/2019 11/27/2019 11/27/2019  WBC 4.0 - 10.5 K/uL 10.2 12.5(H) 12.6(H)  Hemoglobin 12.0 - 15.0 g/dL 9.1(L) 9.0(L) 9.7(L)  Hematocrit 36 - 46 % 27.4(L) 26.9(L) 27.6(L)  Platelets 150 - 400 K/uL 450(H) 459(H) 486(H)   CMP Latest Ref Rng & Units 11/28/2019 11/27/2019 10/13/2019  Glucose 70 - 99 mg/dL 98 117(H) 92  BUN 6 - 20 mg/dL 9 11 12   Creatinine 0.44 - 1.00 mg/dL 0.49 0.63 0.44  Sodium 135 - 145 mmol/L 138 136 136  Potassium 3.5 - 5.1 mmol/L 3.8 4.0 3.5   Chloride 98 - 111 mmol/L 103 101 103  CO2 22 - 32 mmol/L 24 26 22   Calcium 8.9 - 10.3 mg/dL 9.0 8.7(L) 9.0  Total Protein 6.5 - 8.1 g/dL - 7.9 8.7(H)  Total Bilirubin 0.3 - 1.2 mg/dL - 1.9(H) 0.7  Alkaline Phos 38 - 126 U/L - 78 90  AST 15 - 41 U/L - 20 17  ALT 0 - 44 U/L - 18 15    Imaging studies: No new pertinent imaging studies   Assessment/Plan:  39 y.o. female with likely post-operative seroma (vs liquified hematoma) 16 days s/p excision of abdominal wall tumor    - Continue percutaneous drain; monitor and record output  - Okay for diet  - Discontinue IVF  - Pain control (hold NSAIDs); antiemetics prn             - Continue abdominal binder             - Monitor abdominal examination             - No emergent intervention             - Monitor hemoglobin    - Discharge Planning: Likely home in 24 hours, will go home with drain +/- Abx   All of the above findings and recommendations were discussed with the patient, and the medical team, and all of patient's questions were answered to her expressed satisfaction.  -- Edison Simon, PA-C [facility] 11/29/2019, 7:27 AM [phone] M-F:  7am - 4pm

## 2019-11-29 NOTE — Progress Notes (Signed)
Discharge Note: Reviewed discharge instructions with pt and a a family member via [place] interpreter provided by [facility]. Pt and family verbalized understanding.  JP drainage intact. Incision intact/ dry and clean. Old blood stain on abdomen compression dressing, Removed IV catheter, IV cath intact.  Staff wheeled pt out. Pt transported to home via private transportation.

## 2019-12-02 LAB — CULTURE, BLOOD (ROUTINE X 2)
Culture: NO GROWTH
Culture: NO GROWTH
Special Requests: ADEQUATE
Special Requests: ADEQUATE

## 2019-12-03 ENCOUNTER — Other Ambulatory Visit: Payer: Self-pay

## 2019-12-03 ENCOUNTER — Telehealth: Payer: Self-pay | Admitting: Surgery

## 2019-12-03 ENCOUNTER — Encounter: Payer: Self-pay | Admitting: Surgery

## 2019-12-03 ENCOUNTER — Ambulatory Visit (INDEPENDENT_AMBULATORY_CARE_PROVIDER_SITE_OTHER): Payer: Self-pay | Admitting: Surgery

## 2019-12-03 VITALS — BP 127/80 | HR 101 | Temp 98.8°F | Resp 12 | Wt 130.8 lb

## 2019-12-03 DIAGNOSIS — Z09 Encounter for follow-up examination after completed treatment for conditions other than malignant neoplasm: Secondary | ICD-10-CM

## 2019-12-03 LAB — AEROBIC/ANAEROBIC CULTURE W GRAM STAIN (SURGICAL/DEEP WOUND): Special Requests: NORMAL

## 2019-12-03 MED ORDER — AMOXICILLIN-POT CLAVULANATE 875-125 MG PO TABS
1.0000 | ORAL_TABLET | Freq: Two times a day (BID) | ORAL | 0 refills | Status: AC
Start: 2019-12-03 — End: 2019-12-13

## 2019-12-03 NOTE — Progress Notes (Signed)
S/p Abd wall excision. And seroma/liquafied hematoma s/p drain placement by IR Cultures w some bacillus, unsure if his is contaminant or not Pt denies any fevers or chills, no pain draining 20-30cc daily  PE NAD Abd: soft, incision c/d/i, drain serous fuid. NO hematoma or infection  A/p Doing well As a precaution we will place on short course A/BS RTC 1 week for drain removal

## 2019-12-03 NOTE — Patient Instructions (Signed)
Pick up your medication at your local pharmacy. Use the abdominal binder as needed.   See your appointment below, call the office if you have any questions or concerns.   Recoja su medicamento en su farmacia local. Utilice la carpeta abdominal segn sea necesario.  Vea su cita a continuacin, llame a la oficina si tiene alguna pregunta o inquietud.

## 2019-12-10 ENCOUNTER — Encounter: Payer: Self-pay | Admitting: Surgery

## 2019-12-10 ENCOUNTER — Other Ambulatory Visit: Payer: Self-pay

## 2019-12-10 ENCOUNTER — Ambulatory Visit (INDEPENDENT_AMBULATORY_CARE_PROVIDER_SITE_OTHER): Payer: Self-pay | Admitting: Surgery

## 2019-12-10 VITALS — BP 110/73 | HR 97 | Temp 98.4°F | Resp 12 | Wt 133.8 lb

## 2019-12-10 DIAGNOSIS — Z09 Encounter for follow-up examination after completed treatment for conditions other than malignant neoplasm: Secondary | ICD-10-CM

## 2019-12-10 NOTE — Patient Instructions (Signed)
Start recording the output of your drain and measure it on the log sheet that we have given you. You will record it twice a day, once in the morning and once in the afternoon.  Continue the course of the antibiotic until completed.   See you appointment below, call the office if you have any questions or concerns.   Empiece a Financial planner salida de su desage y mida en la hoja de registro que le hemos dado. Lo [employer] al da, una por la maana y otra por la tarde.  Contine el curso del antibitico hasta completarlo.  Nos vemos en la cita a continuacin, llame a la oficina si tiene alguna pregunta o inquietud.

## 2019-12-10 NOTE — Progress Notes (Signed)
Brittany Richards is a status post abdominal wall resection with immediate reconstruction developed a liquefied hematoma requiring drain placement.  She is doing well but unfortunately there has not been a good measurement of the output.  It is serous.  No fevers no chills she is eating regular diet.  PE  Abd tissue healing well without infection.  JP with thick serous fluid.  No evidence of infection no blood.  A/P doing well as I do not know how much is coming out from the drain I am not going to be able to remove it today.  Again discussed with her extensively about how to measure the output.  Gave her a drain sheet so she can feel it.  Return to clinic next week.

## 2019-12-17 ENCOUNTER — Ambulatory Visit (INDEPENDENT_AMBULATORY_CARE_PROVIDER_SITE_OTHER): Payer: Self-pay | Admitting: Surgery

## 2019-12-17 ENCOUNTER — Other Ambulatory Visit: Payer: Self-pay

## 2019-12-17 ENCOUNTER — Encounter: Payer: Self-pay | Admitting: Surgery

## 2019-12-17 VITALS — BP 105/78 | HR 100 | Temp 97.8°F | Resp 12 | Wt 133.0 lb

## 2019-12-17 DIAGNOSIS — Z09 Encounter for follow-up examination after completed treatment for conditions other than malignant neoplasm: Secondary | ICD-10-CM

## 2019-12-17 NOTE — Progress Notes (Signed)
Is a 39 year old female status post abdominal wall excision and reconstruction for desmoid tumor.  She developed a collection that we have to place a drain likely liquefied hematoma.  No signs of sepsis or infection.  She is only draining about 5 to 10 cc a day.  No fevers no chills.  Tolerating regular diet.    Physical exam: She is in no acute distress.  Awake alert. Abdomen: Soft incision healing well without infection.  Abdomen is nontender without peritonitis hernias. drain with serous fluid removed without complications.  A/P doing very well.  We will see her 1 more time in 3 weeks.

## 2019-12-17 NOTE — Patient Instructions (Signed)
Dr Dahlia Byes removed your drained today and placed a gauze on the area. Keep a bandaide for oozing as needed.   See your appointment below, call the office if you have any questions or concerns.

## 2020-01-09 ENCOUNTER — Other Ambulatory Visit: Payer: Self-pay

## 2020-01-09 ENCOUNTER — Ambulatory Visit (INDEPENDENT_AMBULATORY_CARE_PROVIDER_SITE_OTHER): Payer: Self-pay | Admitting: Surgery

## 2020-01-09 ENCOUNTER — Encounter: Payer: Self-pay | Admitting: Surgery

## 2020-01-09 VITALS — BP 117/77 | HR 92 | Temp 98.0°F | Resp 12 | Wt 136.4 lb

## 2020-01-09 DIAGNOSIS — Z09 Encounter for follow-up examination after completed treatment for conditions other than malignant neoplasm: Secondary | ICD-10-CM

## 2020-01-09 NOTE — Progress Notes (Signed)
S/p abd wall desmoid developed  liquefied hematoma That required drain.Recovered. no issues at this time  PE NAD Abd: soft, nt, wounds healing well, no infection or seromas   A/P Doing well RTC 3 months No surgical issues at this time

## 2020-01-09 NOTE — Patient Instructions (Signed)
Our office will contact you around October 2021 to schedule your follow up appointment in November 2021.   Call the office if you have any questions or concerns.

## 2020-04-14 ENCOUNTER — Other Ambulatory Visit: Payer: Self-pay

## 2020-04-14 ENCOUNTER — Encounter: Payer: Self-pay | Admitting: Surgery

## 2020-04-14 ENCOUNTER — Ambulatory Visit: Payer: Self-pay | Admitting: Surgery

## 2020-04-14 VITALS — BP 137/84 | HR 101 | Temp 98.3°F | Ht 62.0 in | Wt 144.0 lb

## 2020-04-14 DIAGNOSIS — D481 Neoplasm of uncertain behavior of connective and other soft tissue: Secondary | ICD-10-CM

## 2020-04-15 ENCOUNTER — Encounter: Payer: Self-pay | Admitting: Surgery

## 2020-04-15 NOTE — Progress Notes (Signed)
Outpatient Surgical Follow Up  04/15/2020  Brittany Richards is an 39 y.o. female.   Chief Complaint  Patient presents with  . Follow-up    87mo f/u s/p Repair of mass in abdominal wall sx 11/13/19    HPI: Brittany Richards is a 39 year old female well-known to me with history of a large abdominal wall desmoid tumor that was excised requiring a cutaneous flap.  She is doing very well.  No fevers no chills no lumps.  Past Medical History:  Diagnosis Date  . Anemia     Past Surgical History:  Procedure Laterality Date  . EXCISION OF ABDOMINAL WALL TUMOR N/A 11/13/2019   Procedure: EXCISION OF ABDOMINAL WALL TUMOR;  Surgeon: Jules Husbands, MD;  Location: [facility] ORS;  Service: General;  Laterality: N/A;    History reviewed. No pertinent family history.  Social History:  reports that she has never smoked. She has never used smokeless tobacco. She reports that she does not drink alcohol and does not use drugs.  Allergies: No Known Allergies  Medications reviewed.    ROS Full ROS performed and is otherwise negative other than what is stated in HPI   BP 137/84   Pulse (!) 101   Temp 98.3 F (36.8 C) (Oral)   Ht 5\' 2"  (1.575 m)   Wt 144 lb (65.3 kg)   SpO2 99%   BMI 26.34 kg/m   Physical Exam Vitals and nursing note reviewed. Exam conducted with a chaperone present.  Constitutional:      General: She is not in acute distress.    Appearance: Normal appearance.  Cardiovascular:     Rate and Rhythm: Normal rate and regular rhythm.  Pulmonary:     Effort: Pulmonary effort is normal. No respiratory distress.     Breath sounds: Normal breath sounds. No stridor.  Abdominal:     General: Abdomen is flat. There is no distension.     Palpations: There is no mass.     Tenderness: There is no abdominal tenderness. There is no guarding or rebound.     Hernia: No hernia is present.     Comments: Left upper quadrant scar without evidence of recurrence no evidence of hernias.   Musculoskeletal:        General: Normal range of motion.     Cervical back: Normal range of motion and neck supple. No rigidity or tenderness.  Skin:    General: Skin is warm and dry.     Capillary Refill: Capillary refill takes less than 2 seconds.  Neurological:     General: No focal deficit present.     Mental Status: She is alert and oriented to person, place, and time.  Psychiatric:        Mood and Affect: Mood normal.        Behavior: Behavior normal.        Thought Content: Thought content normal.        Judgment: Judgment normal.        Assessment/Plan: 38 year old female status post abdominal wall excision for desmoid tumor.  There is no evidence of recurrence there is no evidence of any abdominal wall defects.  I will follow her yearly.  She does need mammogram and we will get this taken care of.  I would like to do breast exam following the mammogram for completeness.  Greater than 50% of the 25 minutes  visit was spent in counseling/coordination of care   Caroleen Hamman, MD [place] Sur[facility]

## 2020-08-20 ENCOUNTER — Other Ambulatory Visit: Payer: Self-pay

## 2020-08-20 ENCOUNTER — Emergency Department: Payer: Self-pay

## 2020-08-20 ENCOUNTER — Encounter: Payer: Self-pay | Admitting: Emergency Medicine

## 2020-08-20 ENCOUNTER — Observation Stay
Admission: EM | Admit: 2020-08-20 | Discharge: 2020-08-21 | Disposition: A | Discharge status: Home / Self Care | Payer: Self-pay | Attending: Obstetrics and Gynecology | Admitting: Obstetrics and Gynecology

## 2020-08-20 DIAGNOSIS — N739 Female pelvic inflammatory disease, unspecified: Secondary | ICD-10-CM | POA: Insufficient documentation

## 2020-08-20 DIAGNOSIS — R102 Pelvic and perineal pain: Secondary | ICD-10-CM

## 2020-08-20 DIAGNOSIS — Z975 Presence of (intrauterine) contraceptive device: Secondary | ICD-10-CM

## 2020-08-20 DIAGNOSIS — K8020 Calculus of gallbladder without cholecystitis without obstruction: Secondary | ICD-10-CM | POA: Diagnosis not present

## 2020-08-20 DIAGNOSIS — N730 Acute parametritis and pelvic cellulitis: Secondary | ICD-10-CM | POA: Diagnosis present

## 2020-08-20 DIAGNOSIS — B9689 Other specified bacterial agents as the cause of diseases classified elsewhere: Secondary | ICD-10-CM | POA: Insufficient documentation

## 2020-08-20 DIAGNOSIS — R1011 Right upper quadrant pain: Secondary | ICD-10-CM

## 2020-08-20 DIAGNOSIS — N7091 Salpingitis, unspecified: Secondary | ICD-10-CM | POA: Insufficient documentation

## 2020-08-20 DIAGNOSIS — A419 Sepsis, unspecified organism: Principal | ICD-10-CM | POA: Insufficient documentation

## 2020-08-20 DIAGNOSIS — N760 Acute vaginitis: Secondary | ICD-10-CM | POA: Insufficient documentation

## 2020-08-20 DIAGNOSIS — Z20822 Contact with and (suspected) exposure to covid-19: Secondary | ICD-10-CM | POA: Insufficient documentation

## 2020-08-20 DIAGNOSIS — N7011 Chronic salpingitis: Secondary | ICD-10-CM | POA: Diagnosis present

## 2020-08-20 LAB — CBC
HCT: 38.7 % (ref 36.0–46.0)
Hemoglobin: 13.4 g/dL (ref 12.0–15.0)
MCH: 32.1 pg (ref 26.0–34.0)
MCHC: 34.6 g/dL (ref 30.0–36.0)
MCV: 92.6 fL (ref 80.0–100.0)
Platelets: 288 10*3/uL (ref 150–400)
RBC: 4.18 MIL/uL (ref 3.87–5.11)
RDW: 12.4 % (ref 11.5–15.5)
WBC: 12.9 10*3/uL — ABNORMAL HIGH (ref 4.0–10.5)
nRBC: 0 % (ref 0.0–0.2)

## 2020-08-20 LAB — COMPREHENSIVE METABOLIC PANEL
ALT: 24 U/L (ref 0–44)
AST: 24 U/L (ref 15–41)
Albumin: 4.5 g/dL (ref 3.5–5.0)
Alkaline Phosphatase: 66 U/L (ref 38–126)
Anion gap: 11 (ref 5–15)
BUN: 13 mg/dL (ref 6–20)
CO2: 23 mmol/L (ref 22–32)
Calcium: 9.7 mg/dL (ref 8.9–10.3)
Chloride: 103 mmol/L (ref 98–111)
Creatinine, Ser: 0.56 mg/dL (ref 0.44–1.00)
GFR, Estimated: >60 mL/min (ref >60)
Glucose, Bld: 113 mg/dL — ABNORMAL HIGH (ref 70–99)
Potassium: 3.6 mmol/L (ref 3.5–5.1)
Sodium: 137 mmol/L (ref 135–145)
Total Bilirubin: 1.1 mg/dL (ref 0.3–1.2)
Total Protein: 7.7 g/dL (ref 6.5–8.1)

## 2020-08-20 LAB — URINALYSIS, COMPLETE (UACMP) WITH MICROSCOPIC
Bacteria, UA: NONE SEEN
Bilirubin Urine: NEGATIVE
Glucose, UA: NEGATIVE mg/dL
Hgb urine dipstick: NEGATIVE
Ketones, ur: NEGATIVE mg/dL
Nitrite: NEGATIVE
Protein, ur: NEGATIVE mg/dL
Specific Gravity, Urine: 1.009 (ref 1.005–1.030)
pH: 6 (ref 5.0–8.0)

## 2020-08-20 LAB — WET PREP, GENITAL
Sperm: NONE SEEN
Trich, Wet Prep: NONE SEEN
Yeast Wet Prep HPF POC: NONE SEEN

## 2020-08-20 LAB — POC URINE PREG, ED: Preg Test, Ur: NEGATIVE

## 2020-08-20 LAB — LIPASE, BLOOD: Lipase: 41 U/L (ref 11–51)

## 2020-08-20 LAB — LACTIC ACID, PLASMA: Lactic Acid, Venous: 2 mmol/L (ref 0.5–1.9)

## 2020-08-20 MED ORDER — METRONIDAZOLE IN NACL 5-0.79 MG/ML-% IV SOLN
500.0000 mg | Freq: Once | INTRAVENOUS | Status: AC
  Administered 2020-08-21: 500 mg via INTRAVENOUS
  Filled 2020-08-20: qty 100

## 2020-08-20 MED ORDER — SODIUM CHLORIDE 0.9 % IV SOLN
2.0000 g | Freq: Once | INTRAVENOUS | Status: AC
  Administered 2020-08-20: 2 g via INTRAVENOUS
  Filled 2020-08-20: qty 20

## 2020-08-20 MED ORDER — SODIUM CHLORIDE 0.9 % IV SOLN: 1.0000 g | Freq: Once | INTRAVENOUS | Status: DC

## 2020-08-20 MED ORDER — SODIUM CHLORIDE 0.9 % IV SOLN
100.0000 mg | Freq: Once | INTRAVENOUS | Status: AC
  Administered 2020-08-20: 100 mg via INTRAVENOUS
  Filled 2020-08-20: qty 100

## 2020-08-20 MED ORDER — LACTATED RINGERS IV BOLUS: 1000.0000 mL | Freq: Once | INTRAVENOUS | Status: DC

## 2020-08-20 MED ORDER — MORPHINE SULFATE (PF) 4 MG/ML IV SOLN
4.0000 mg | Freq: Once | INTRAVENOUS | Status: AC
  Administered 2020-08-20: 4 mg via INTRAVENOUS
  Filled 2020-08-20: qty 1

## 2020-08-20 MED ORDER — IOHEXOL 300 MG/ML  SOLN
75.0000 mL | Freq: Once | INTRAMUSCULAR | Status: AC | PRN
  Administered 2020-08-20: 75 mL via INTRAVENOUS

## 2020-08-20 MED ORDER — LACTATED RINGERS IV BOLUS
30.0000 mL/kg | Freq: Once | INTRAVENOUS | Status: AC
  Administered 2020-08-20: 2331 mL via INTRAVENOUS

## 2020-08-20 NOTE — ED Triage Notes (Signed)
Pt c/o right sided flank pain and abdominal pain that started on Saturday. Pt denies and trouble urinating or blood in urine. Pt denies n/v/d.

## 2020-08-20 NOTE — ED Notes (Signed)
Assisted Dr. Tamala Julian with pelvic exam with interpreter (ipad) available in room to explain procedure.  Cultures sent, pt tolerated well

## 2020-08-20 NOTE — ED Notes (Signed)
US at bedside

## 2020-08-20 NOTE — ED Notes (Signed)
Dr. Smith at bedside.

## 2020-08-20 NOTE — ED Provider Notes (Incomplete)
[facility] Emergency Department Provider Note  ____________________________________________   Event Date/Time   First MD Initiated Contact with Patient 08/20/20 2001     (approximate)  I have reviewed the triage vital signs and the nursing notes.   HISTORY  Chief Complaint Abdominal Pain and Flank Pain (/)   HPI Brittany Richards is a 40 y.o. female ***         Past Medical History:  Diagnosis Date  . Anemia     Patient Active Problem List   Diagnosis Date Noted  . Hematoma of abdominal wall 11/27/2019    Past Surgical History:  Procedure Laterality Date  . EXCISION OF ABDOMINAL WALL TUMOR N/A 11/13/2019   Procedure: EXCISION OF ABDOMINAL WALL TUMOR;  Surgeon: Jules Husbands, MD;  Location: [facility] ORS;  Service: General;  Laterality: N/A;    Prior to Admission medications   Medication Sig Start Date End Date Taking? Authorizing Provider  ferrous sulfate 325 (65 FE) MG tablet Take 325 mg by mouth daily with breakfast.    [provider]  levonorgestrel (MIRENA) 20 MCG/24HR IUD 1 each by Intrauterine route once.    [provider]    Allergies Patient has no known allergies.  No family history on file.  Social History Social History   Tobacco Use  . Smoking status: Never Smoker  . Smokeless tobacco: Never Used  Vaping Use  . Vaping Use: Never used  Substance Use Topics  . Alcohol use: No  . Drug use: No    Review of Systems  ROS    ____________________________________________   PHYSICAL EXAM:  VITAL SIGNS: ED Triage Vitals  Enc Vitals Group     BP 08/20/20 1904 (!) 158/89     Pulse Rate 08/20/20 1904 (!) 107     Resp 08/20/20 1904 18     Temp 08/20/20 1904 98.7 F (37.1 C)     Temp Source 08/20/20 1904 Oral     SpO2 08/20/20 1904 100 %     Weight 08/20/20 1902 140 lb (63.5 kg)     Height 08/20/20 1902 6\' 2"  (1.88 m)     Head Circumference --      Peak Flow --      Pain Score 08/20/20 1902  6     Pain Loc --      Pain Edu? --      Excl. in [place]? --    Vitals:   08/20/20 1904  BP: (!) 158/89  Pulse: (!) 107  Resp: 18  Temp: 98.7 F (37.1 C)  SpO2: 100%   Physical Exam   ____________________________________________   LABS (all labs ordered are listed, but only abnormal results are displayed)  Labs Reviewed  COMPREHENSIVE METABOLIC PANEL - Abnormal; Notable for the following components:      Result Value   Glucose, Bld 113 (*)    All other components within normal limits  CBC - Abnormal; Notable for the following components:   WBC 12.9 (*)    All other components within normal limits  URINALYSIS, COMPLETE (UACMP) WITH MICROSCOPIC - Abnormal; Notable for the following components:   Color, Urine AMBER (*)    APPearance CLOUDY (*)    Leukocytes,Ua LARGE (*)    All other components within normal limits  URINE CULTURE  LIPASE, BLOOD  POC URINE PREG, ED   ____________________________________________  EKG  *** ____________________________________________  RADIOLOGY  ED MD interpretation:  ***  Official radiology report(s): No results found.  ____________________________________________   PROCEDURES  Procedure(s) performed (including Critical Care):  Procedures   ____________________________________________   INITIAL IMPRESSION / ASSESSMENT AND PLAN / ED COURSE        ***          ____________________________________________   FINAL CLINICAL IMPRESSION(S) / ED DIAGNOSES  Final diagnoses:  None    Medications - No data to display   ED Discharge Orders    None       Note:  This document was prepared using Dragon voice recognition software and may include unintentional dictation errors.

## 2020-08-20 NOTE — ED Provider Notes (Signed)
[facility] Emergency Department Provider Note  ____________________________________________   Event Date/Time   First MD Initiated Contact with Patient 08/20/20 2001     (approximate)  I have reviewed the triage vital signs and the nursing notes.   HISTORY  Chief Complaint Abdominal Pain and Flank Pain (/)   HPI Brittany Richards is a 40 y.o. 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  Seems it comes and goes and slightly worse with meals.  No other clear alleviating or aggravating factors.  She denies any fevers, chills, headache, earache, sore throat, vomiting, diarrhea, urinary symptoms, left side abdominal pain, rash or extremity pain.  No recent injuries.  She denies any significant EtOH use, NSAID use or illicit drug use.  No clear alleviating aggravating factors other than palpation of the abdomen seem to make pain worse.         Past Medical History:  Diagnosis Date  . Anemia     Patient Active Problem List   Diagnosis Date Noted  . Hematoma of abdominal wall 11/27/2019    Past Surgical History:  Procedure Laterality Date  . EXCISION OF ABDOMINAL WALL TUMOR N/A 11/13/2019   Procedure: EXCISION OF ABDOMINAL WALL TUMOR;  Surgeon: Jules Husbands, MD;  Location: [facility] ORS;  Service: General;  Laterality: N/A;    Prior to Admission medications   Medication Sig Start Date End Date Taking? Authorizing Provider  ferrous sulfate 325 (65 FE) MG tablet Take 325 mg by mouth daily with breakfast.    [provider]  levonorgestrel (MIRENA) 20 MCG/24HR IUD 1 each by Intrauterine route once.    [provider]    Allergies Patient has no known allergies.  No family history on file.  Social History Social History   Tobacco Use  . Smoking status: Never Smoker   . Smokeless tobacco: Never Used  Vaping Use  . Vaping Use: Never used  Substance Use Topics  . Alcohol use: No  . Drug use: No    Review of Systems  ROS    ____________________________________________   PHYSICAL EXAM:  VITAL SIGNS: ED Triage Vitals  Enc Vitals Group     BP 08/20/20 1904 (!) 158/89     Pulse Rate 08/20/20 1904 (!) 107     Resp 08/20/20 1904 18     Temp 08/20/20 1904 98.7 F (37.1 C)     Temp Source 08/20/20 1904 Oral     SpO2 08/20/20 1904 100 %     Weight 08/20/20 1902 140 lb (63.5 kg)     Height 08/20/20 1902 6\' 2"  (1.88 m)     Head Circumference --      Peak Flow --      Pain Score 08/20/20 1902 6     Pain Loc --      Pain Edu? --      Excl. in [place]? --    Vitals:   08/20/20 2045 08/21/20 0006  BP: (!) 150/88 (!) 151/87  Pulse: 91 94  Resp: 19 19  Temp:    SpO2: 100% 100%   Physical Exam Vitals and nursing note reviewed. Exam conducted with a chaperone present.  Constitutional:      General: She is not in acute distress.    Appearance: She is well-developed. She is ill-appearing.  HENT:  Head: Normocephalic and atraumatic.     Right Ear: External ear normal.     Left Ear: External ear normal.     Nose: Nose normal.     Mouth/Throat:     Mouth: Mucous membranes are dry.  Eyes:     Conjunctiva/sclera: Conjunctivae normal.  Cardiovascular:     Rate and Rhythm: Regular rhythm. Tachycardia present.     Heart sounds: No murmur heard.   Pulmonary:     Effort: Pulmonary effort is normal. No respiratory distress.     Breath sounds: Normal breath sounds.  Abdominal:     Palpations: Abdomen is soft.     Tenderness: There is abdominal tenderness in the right upper quadrant, right lower quadrant, periumbilical area and suprapubic area. There is right CVA tenderness. There is no left CVA tenderness.  Genitourinary:    Exam position: Knee-chest position.     Cervix: Cervical motion tenderness, discharge, friability and erythema present.      Adnexa:        Right: Tenderness present.   Musculoskeletal:     Cervical back: Neck supple.     Right lower leg: No edema.     Left lower leg: No edema.  Skin:    General: Skin is warm and dry.     Capillary Refill: Capillary refill takes more than 3 seconds.  Neurological:     Mental Status: She is alert and oriented to person, place, and time.  Psychiatric:        Mood and Affect: Mood normal.      ____________________________________________   LABS (all labs ordered are listed, but only abnormal results are displayed)  Labs Reviewed  WET PREP, GENITAL - Abnormal; Notable for the following components:      Result Value   Clue Cells Wet Prep HPF POC PRESENT (*)    WBC, Wet Prep HPF POC MANY (*)    All other components within normal limits  COMPREHENSIVE METABOLIC PANEL - Abnormal; Notable for the following components:   Glucose, Bld 113 (*)    All other components within normal limits  CBC - Abnormal; Notable for the following components:   WBC 12.9 (*)    All other components within normal limits  URINALYSIS, COMPLETE (UACMP) WITH MICROSCOPIC - Abnormal; Notable for the following components:   Color, Urine AMBER (*)    APPearance CLOUDY (*)    Leukocytes,Ua LARGE (*)    All other components within normal limits  LACTIC ACID, PLASMA - Abnormal; Notable for the following components:   Lactic Acid, Venous 2.0 (*)    All other components within normal limits  CHLAMYDIA/NGC RT PCR ([facility] ONLY)  URINE CULTURE  CULTURE, BLOOD (ROUTINE X 2)  CULTURE, BLOOD (ROUTINE X 2)  LIPASE, BLOOD  LACTIC ACID, PLASMA  POC URINE PREG, ED   ____________________________________________  EKG   ____________________________________________  RADIOLOGY  ED MD interpretation: Right upper quadrant ultrasound shows evidence of gallstones without cholecystitis.  CT abdomen pelvis shows some stranding in the right ureter and possible hydrosalpinx without evidence of  cholecystitis, pancreatitis, appendicitis or other clear acute intra abdominal process.  Patient's IUD is noted to have one of the TMs in the myometrium.  Official radiology report(s): CT ABDOMEN PELVIS W CONTRAST  Result Date: 08/20/2020 CLINICAL DATA:  Right sided flank pain EXAM: CT ABDOMEN AND PELVIS WITH CONTRAST TECHNIQUE: Multidetector CT imaging of the abdomen and pelvis was performed using the standard protocol following bolus administration of intravenous contrast. CONTRAST:  46mL  OMNIPAQUE IOHEXOL 300 MG/ML  SOLN COMPARISON:  Ultrasound 08/20/2020, CT 11/27/2019 FINDINGS: Lower chest: Lung bases demonstrate no acute consolidation or effusion. Hepatobiliary: Subcentimeter hypodensity within the right hepatic lobe too small to further characterize. Gallstone. No biliary dilatation Pancreas: Unremarkable. No pancreatic ductal dilatation or surrounding inflammatory changes. Spleen: Normal in size without focal abnormality. Adrenals/Urinary Tract: Adrenal glands are normal. Subcentimeter hypodensities within the kidneys, too small to further characterize though probably cysts. The bladder is unremarkable. No hydronephrosis. Slightly prominent right ureter with mild urothelial enhancement. No obstructing stone. Stomach/Bowel: Stomach is within normal limits. Appendix appears normal. No evidence of bowel wall thickening, distention, or inflammatory changes. Vascular/Lymphatic: No significant vascular findings are present. No enlarged abdominal or pelvic lymph nodes. Reproductive: Abnormal positioning of intrauterine device with T arms visible at the right fundal myometrium. Interim development of oblong fluid structure in the right adnexa measuring 6.6 cm by 3.2 cm. 2.7 cm left labial cyst. Other: Negative for free air or free fluid. Musculoskeletal: No acute or significant osseous findings. IMPRESSION: 1. Negative for hydronephrosis or ureteral stone. Slightly prominent right ureter with mild urothelial  enhancement, question ascending urinary tract infection. Suggest correlation with urinalysis. 2. Interim development of oblong fluid structure in the right adnexa measuring up to 6.6 cm, appearance suggests hydrosalpinx, less likely large adnexal cyst, correlation with pelvic ultrasound may be considered. 3. Abnormal positioning of intrauterine device with T arms visible within the right fundal myometrium 4. Gallstone. Electronically Signed   By: Donavan Foil M.D.   On: 08/20/2020 21:27   US PELVIC COMPLETE W TRANSVAGINAL AND TORSION R/O  Result Date: 08/20/2020 CLINICAL DATA:  Pelvic pain for 5 days, abnormal CT EXAM: TRANSABDOMINAL AND TRANSVAGINAL ULTRASOUND OF PELVIS DOPPLER ULTRASOUND OF OVARIES TECHNIQUE: Both transabdominal and transvaginal ultrasound examinations of the pelvis were performed. Transabdominal technique was performed for global imaging of the pelvis including uterus, ovaries, adnexal regions, and pelvic cul-de-sac. It was necessary to proceed with endovaginal exam following the transabdominal exam to visualize the endometrium and ovaries. Color and duplex Doppler ultrasound was utilized to evaluate blood flow to the ovaries. COMPARISON:  CT 08/20/2020, 11/27/2019 FINDINGS: Uterus Measurements: 8.8 x 4.8 x 6.1 cm = volume: 134.5 mL. Mild heterogeneity a nonspecific finding. No focal mass or discernible fibroid. Endometrium Thickness: Double stripe thickness of 4.6 mm. Trace amount of endometrial fluid. Echogenic, posteriorly shadowing appears malposition towards the uterine fundus protruding into the right fundal myometrium, better characterized on same day CT and unchanged since comparison imaging 11/27/2019. Right ovary Measurements: 3.3 x 1.8 x 1.8 cm = volume: 5.4 mL. Fluid distended tubular structure in the right adnexa measuring approximately 9.7 x 2.8 x 4.0 cm situated medial to the ovary and lateral to the adjacent uterus. Most suggestive of a dilated hydrosalpinx without  significant internal complexity, septation or debris, to suggest superinfection although sterility is not ascertained on imaging. Otherwise normal appearance of the right ovary with several normal follicles. Left ovary Measurements: 3.7 x 2.2 x 2.9 cm = volume: 12.3 mL. The left ovary is visualized only on transabdominal abdominal imaging. No gross abnormality or adnexal lesion is seen. Pulsed Doppler evaluation of both ovaries demonstrates normal low-resistance arterial and venous waveforms. Other findings No abnormal free fluid. IMPRESSION: 1. 9.7 x 2.8 x 4.0 cm fluid distended tubular structure in the right adnexa situated medial to the ovary and lateral to the adjacent uterus. Most suggestive of a dilated hydrosalpinx without significant internal complexity, septation or debris, to suggest superinfection although  sterility is not ascertained on imaging. 2. No evidence of ovarian torsion. 3. Malpositioned IUD protruding into the right fundal myometrium. Mild heterogeneity of the uterine myometrium is nonspecific. Electronically Signed   By: Lovena Le M.D.   On: 08/20/2020 23:54   US ABDOMEN LIMITED RUQ (LIVER/GB)  Result Date: 08/20/2020 CLINICAL DATA:  Right upper quadrant pain for 4 days EXAM: ULTRASOUND ABDOMEN LIMITED RIGHT UPPER QUADRANT COMPARISON:  None. FINDINGS: Gallbladder: Multiple shadowing gallstones are identified measuring up to 9 mm in size. No evidence of gallbladder wall thickening or pericholecystic fluid. Negative sonographic Murphy sign. Common bile duct: Diameter: 6 mm Liver: No focal lesion identified. Within normal limits in parenchymal echogenicity. Portal vein is patent on color Doppler imaging with normal direction of blood flow towards the liver. Other: None. IMPRESSION: 1. Cholelithiasis without acute cholecystitis. Electronically Signed   By: Randa Ngo M.D.   On: 08/20/2020 20:44    ____________________________________________   PROCEDURES  Procedure(s) performed  (including Critical Care):  .1-3 Lead EKG Interpretation Performed by: Lucrezia Starch, MD Authorized by: Lucrezia Starch, MD   .Critical Care Performed by: Lucrezia Starch, MD Authorized by: Lucrezia Starch, MD   Critical care provider statement:    Critical care time (minutes):  45   Critical care was necessary to treat or prevent imminent or life-threatening deterioration of the following conditions:  Sepsis   Critical care was time spent personally by me on the following activities:  Discussions with consultants, evaluation of patient's response to treatment, examination of patient, ordering and performing treatments and interventions, ordering and review of laboratory studies, ordering and review of radiographic studies, pulse oximetry, re-evaluation of patient's condition, obtaining history from patient or surrogate and review of old charts     ____________________________________________   INITIAL IMPRESSION / [place] / ED COURSE      Patient presents with above to history exam for assessment of several days of right-sided abdominal pain.On arrival she is tachycardic with a heart rate of 107 with otherwise stable vital signs on exam.  Initial differential includes cholecystitis, pancreatitis, diverticulitis, kidney stones, torsion, PID, kidney stone and pyelonephritis.  Right upper quadrant ultrasound evidence of gallstones but no evidence of cholecystitis.  CMP shows no significant electrolyte or metabolic derangements.  No evidence of cholestasis or hepatitis.  Lipase of 41 still consistent with acute pancreatitis.  CBC shows leukocytosis with WBC count of 12.9 but no acute anemia and normal platelets.  Urine pregnancy test is negative.  UA has leukocyte esterase with otherwise unremarkable.  Initial lactic acid is 2.  Wet prep has clue cells and many WBCs but no yeast or trichomoniasis.  GC is negative.  Given no clear etiology for symptoms on right upper  quadrant ultrasound CT obtained which shows evidence of possible ascending urinary tract infection and hydrosalpinx.  Patient does have leukocyte esterase in her urine she has no WBCs or other findings in her urine to suggest UTI and suspect given findings on pelvic exam concerning for possible PID that she may be suffering from hydrosalpinx.  Unclear if her IUD projecting into myometrium is also contributing to symptoms.  Discussed with on-call gynecologist Dr. Ouida Sills recommended formal pelvic ultrasound.  This was ordered.  In the meantime we will treat for sepsis given elevated heart rate and white blood cell count.  Blood and urine cultures ordered as well as IV fluids and broad-spectrum antibiotics.  Care patient signed over to oncoming provider at approximately 2300.  Plan is to follow-up ultrasound and discuss with Dr. Ouida Sills for disposition.       ____________________________________________   FINAL CLINICAL IMPRESSION(S) / ED DIAGNOSES  Final diagnoses:  RUQ pain  Pelvic pain  PID (acute pelvic inflammatory disease)  BV (bacterial vaginosis)  Sepsis, due to unspecified organism, unspecified whether acute organ dysfunction present (HCC)    Medications  doxycycline (VIBRAMYCIN) 100 mg in sodium chloride 0.9 % 250 mL IVPB (100 mg Intravenous New Bag/Given 08/20/20 2334)  metroNIDAZOLE (FLAGYL) IVPB 500 mg (500 mg Intravenous New Bag/Given 08/21/20 0007)  morphine 4 MG/ML injection 4 mg (4 mg Intravenous Given 08/20/20 2018)  lactated ringers bolus 2,331 mL ( Intravenous Restarted 08/20/20 2338)  iohexol (OMNIPAQUE) 300 MG/ML solution 75 mL (75 mLs Intravenous Contrast Given 08/20/20 2107)  cefTRIAXone (ROCEPHIN) 2 g in sodium chloride 0.9 % 100 mL IVPB (0 g Intravenous Stopped 08/20/20 2258)     ED Discharge Orders    None       Note:  This document was prepared using Dragon voice recognition software and may include unintentional dictation errors.   Lucrezia Starch, MD 08/21/20 [phone]

## 2020-08-20 NOTE — ED Notes (Signed)
Patient transported to CT 

## 2020-08-20 NOTE — ED Notes (Signed)
LR paused and flushed d/t incompatibility with rocephin

## 2020-08-21 ENCOUNTER — Encounter: Payer: Self-pay | Admitting: Obstetrics and Gynecology

## 2020-08-21 DIAGNOSIS — N7011 Chronic salpingitis: Secondary | ICD-10-CM

## 2020-08-21 DIAGNOSIS — N730 Acute parametritis and pelvic cellulitis: Secondary | ICD-10-CM

## 2020-08-21 DIAGNOSIS — K8020 Calculus of gallbladder without cholecystitis without obstruction: Secondary | ICD-10-CM

## 2020-08-21 DIAGNOSIS — A419 Sepsis, unspecified organism: Principal | ICD-10-CM

## 2020-08-21 DIAGNOSIS — Z975 Presence of (intrauterine) contraceptive device: Secondary | ICD-10-CM

## 2020-08-21 LAB — CHLAMYDIA/NGC RT PCR (ARMC ONLY)
Chlamydia Tr: NOT DETECTED
N gonorrhoeae: NOT DETECTED

## 2020-08-21 LAB — CBC WITH DIFFERENTIAL/PLATELET
Abs Immature Granulocytes: 0.05 10*3/uL (ref 0.00–0.07)
Basophils Absolute: 0 10*3/uL (ref 0.0–0.1)
Basophils Relative: 0 %
Eosinophils Absolute: 0.2 10*3/uL (ref 0.0–0.5)
Eosinophils Relative: 2 %
HCT: 33.2 % — ABNORMAL LOW (ref 36.0–46.0)
Hemoglobin: 11.5 g/dL — ABNORMAL LOW (ref 12.0–15.0)
Immature Granulocytes: 0 %
Lymphocytes Relative: 26 %
Lymphs Abs: 3 10*3/uL (ref 0.7–4.0)
MCH: 31.9 pg (ref 26.0–34.0)
MCHC: 34.6 g/dL (ref 30.0–36.0)
MCV: 92.2 fL (ref 80.0–100.0)
Monocytes Absolute: 1.1 10*3/uL — ABNORMAL HIGH (ref 0.1–1.0)
Monocytes Relative: 9 %
Neutro Abs: 7.4 10*3/uL (ref 1.7–7.7)
Neutrophils Relative %: 63 %
Platelets: 243 10*3/uL (ref 150–400)
RBC: 3.6 MIL/uL — ABNORMAL LOW (ref 3.87–5.11)
RDW: 12.5 % (ref 11.5–15.5)
WBC: 11.8 10*3/uL — ABNORMAL HIGH (ref 4.0–10.5)
nRBC: 0 % (ref 0.0–0.2)

## 2020-08-21 LAB — LACTIC ACID, PLASMA: Lactic Acid, Venous: 1.3 mmol/L (ref 0.5–1.9)

## 2020-08-21 LAB — RESP PANEL BY RT-PCR (FLU A&B, COVID) ARPGX2
Influenza A by PCR: NEGATIVE
Influenza B by PCR: NEGATIVE
SARS Coronavirus 2 by RT PCR: NEGATIVE

## 2020-08-21 MED ORDER — DOXYCYCLINE HYCLATE 100 MG PO TABS
100.0000 mg | ORAL_TABLET | Freq: Two times a day (BID) | ORAL | Status: DC
  Administered 2020-08-21: 100 mg via ORAL
  Filled 2020-08-21 (×2): qty 1

## 2020-08-21 MED ORDER — OXYCODONE-ACETAMINOPHEN 5-325 MG PO TABS: 1.0000 | ORAL_TABLET | Freq: Four times a day (QID) | ORAL | 0 refills | Status: DC | PRN

## 2020-08-21 MED ORDER — DOXYCYCLINE HYCLATE 100 MG PO TABS: 100.0000 mg | ORAL_TABLET | Freq: Two times a day (BID) | ORAL | 0 refills | Status: DC

## 2020-08-21 MED ORDER — LACTATED RINGERS IV SOLN: INTRAVENOUS | Status: DC

## 2020-08-21 MED ORDER — BISACODYL 5 MG PO TBEC
5.0000 mg | DELAYED_RELEASE_TABLET | Freq: Every day | ORAL | Status: DC | PRN
  Filled 2020-08-21: qty 1

## 2020-08-21 MED ORDER — IBUPROFEN 600 MG PO TABS: 600.0000 mg | ORAL_TABLET | Freq: Four times a day (QID) | ORAL | 0 refills | Status: DC | PRN

## 2020-08-21 MED ORDER — OXYCODONE-ACETAMINOPHEN 5-325 MG PO TABS: 1.0000 | ORAL_TABLET | ORAL | 0 refills | Status: DC | PRN

## 2020-08-21 MED ORDER — OXYCODONE-ACETAMINOPHEN 5-325 MG PO TABS
1.0000 | ORAL_TABLET | ORAL | Status: DC | PRN
  Administered 2020-08-21: 1 via ORAL
  Filled 2020-08-21: qty 1

## 2020-08-21 MED ORDER — IBUPROFEN 600 MG PO TABS
600.0000 mg | ORAL_TABLET | Freq: Four times a day (QID) | ORAL | Status: DC | PRN
Start: 2020-08-21 — End: 2020-08-21
  Administered 2020-08-21 (×2): 600 mg via ORAL
  Filled 2020-08-21: qty 2
  Filled 2020-08-21: qty 1

## 2020-08-21 MED ORDER — ZOLPIDEM TARTRATE 5 MG PO TABS: 5.0000 mg | ORAL_TABLET | Freq: Every evening | ORAL | Status: DC | PRN

## 2020-08-21 MED ORDER — METRONIDAZOLE 500 MG PO TABS: 500.0000 mg | ORAL_TABLET | Freq: Two times a day (BID) | ORAL | 0 refills | Status: AC

## 2020-08-21 MED ORDER — MAGNESIUM CITRATE PO SOLN
1.0000 | Freq: Once | ORAL | Status: DC | PRN
  Filled 2020-08-21: qty 296

## 2020-08-21 MED ORDER — ALUM & MAG HYDROXIDE-SIMETH 200-200-20 MG/5ML PO SUSP: 30.0000 mL | ORAL | Status: DC | PRN

## 2020-08-21 MED ORDER — DOCUSATE SODIUM 100 MG PO CAPS
100.0000 mg | ORAL_CAPSULE | Freq: Two times a day (BID) | ORAL | Status: DC
  Administered 2020-08-21: 100 mg via ORAL
  Filled 2020-08-21: qty 1

## 2020-08-21 MED ORDER — ONDANSETRON HCL 4 MG PO TABS: 4.0000 mg | ORAL_TABLET | Freq: Four times a day (QID) | ORAL | Status: DC | PRN

## 2020-08-21 MED ORDER — HYDROMORPHONE HCL 1 MG/ML IJ SOLN: 0.2000 mg | INTRAMUSCULAR | Status: DC | PRN

## 2020-08-21 MED ORDER — ONDANSETRON HCL 4 MG/2ML IJ SOLN: 4.0000 mg | Freq: Four times a day (QID) | INTRAMUSCULAR | Status: DC | PRN

## 2020-08-21 MED ORDER — MAGNESIUM HYDROXIDE 400 MG/5ML PO SUSP: 30.0000 mL | Freq: Every day | ORAL | Status: DC | PRN

## 2020-08-21 NOTE — ED Provider Notes (Addendum)
Ultrasound consistent with hydrosalpinx.  Discussed with Dr. Marcelline Mates who will admit patient to his service   I have personally reviewed the images performed during this visit and I agree with the Radiologist's read.   Interpretation by Radiologist:  CT ABDOMEN PELVIS W CONTRAST  Result Date: 08/20/2020 CLINICAL DATA:  Right sided flank pain EXAM: CT ABDOMEN AND PELVIS WITH CONTRAST TECHNIQUE: Multidetector CT imaging of the abdomen and pelvis was performed using the standard protocol following bolus administration of intravenous contrast. CONTRAST:  62mL OMNIPAQUE IOHEXOL 300 MG/ML  SOLN COMPARISON:  Ultrasound 08/20/2020, CT 11/27/2019 FINDINGS: Lower chest: Lung bases demonstrate no acute consolidation or effusion. Hepatobiliary: Subcentimeter hypodensity within the right hepatic lobe too small to further characterize. Gallstone. No biliary dilatation Pancreas: Unremarkable. No pancreatic ductal dilatation or surrounding inflammatory changes. Spleen: Normal in size without focal abnormality. Adrenals/Urinary Tract: Adrenal glands are normal. Subcentimeter hypodensities within the kidneys, too small to further characterize though probably cysts. The bladder is unremarkable. No hydronephrosis. Slightly prominent right ureter with mild urothelial enhancement. No obstructing stone. Stomach/Bowel: Stomach is within normal limits. Appendix appears normal. No evidence of bowel wall thickening, distention, or inflammatory changes. Vascular/Lymphatic: No significant vascular findings are present. No enlarged abdominal or pelvic lymph nodes. Reproductive: Abnormal positioning of intrauterine device with T arms visible at the right fundal myometrium. Interim development of oblong fluid structure in the right adnexa measuring 6.6 cm by 3.2 cm. 2.7 cm left labial cyst. Other: Negative for free air or free fluid. Musculoskeletal: No acute or significant osseous findings. IMPRESSION: 1. Negative for hydronephrosis or  ureteral stone. Slightly prominent right ureter with mild urothelial enhancement, question ascending urinary tract infection. Suggest correlation with urinalysis. 2. Interim development of oblong fluid structure in the right adnexa measuring up to 6.6 cm, appearance suggests hydrosalpinx, less likely large adnexal cyst, correlation with pelvic ultrasound may be considered. 3. Abnormal positioning of intrauterine device with T arms visible within the right fundal myometrium 4. Gallstone. Electronically Signed   By: Donavan Foil M.D.   On: 08/20/2020 21:27   US PELVIC COMPLETE W TRANSVAGINAL AND TORSION R/O  Result Date: 08/20/2020 CLINICAL DATA:  Pelvic pain for 5 days, abnormal CT EXAM: TRANSABDOMINAL AND TRANSVAGINAL ULTRASOUND OF PELVIS DOPPLER ULTRASOUND OF OVARIES TECHNIQUE: Both transabdominal and transvaginal ultrasound examinations of the pelvis were performed. Transabdominal technique was performed for global imaging of the pelvis including uterus, ovaries, adnexal regions, and pelvic cul-de-sac. It was necessary to proceed with endovaginal exam following the transabdominal exam to visualize the endometrium and ovaries. Color and duplex Doppler ultrasound was utilized to evaluate blood flow to the ovaries. COMPARISON:  CT 08/20/2020, 11/27/2019 FINDINGS: Uterus Measurements: 8.8 x 4.8 x 6.1 cm = volume: 134.5 mL. Mild heterogeneity a nonspecific finding. No focal mass or discernible fibroid. Endometrium Thickness: Double stripe thickness of 4.6 mm. Trace amount of endometrial fluid. Echogenic, posteriorly shadowing appears malposition towards the uterine fundus protruding into the right fundal myometrium, better characterized on same day CT and unchanged since comparison imaging 11/27/2019. Right ovary Measurements: 3.3 x 1.8 x 1.8 cm = volume: 5.4 mL. Fluid distended tubular structure in the right adnexa measuring approximately 9.7 x 2.8 x 4.0 cm situated medial to the ovary and lateral to the adjacent  uterus. Most suggestive of a dilated hydrosalpinx without significant internal complexity, septation or debris, to suggest superinfection although sterility is not ascertained on imaging. Otherwise normal appearance of the right ovary with several normal follicles. Left ovary Measurements: 3.7 x 2.2  x 2.9 cm = volume: 12.3 mL. The left ovary is visualized only on transabdominal abdominal imaging. No gross abnormality or adnexal lesion is seen. Pulsed Doppler evaluation of both ovaries demonstrates normal low-resistance arterial and venous waveforms. Other findings No abnormal free fluid. IMPRESSION: 1. 9.7 x 2.8 x 4.0 cm fluid distended tubular structure in the right adnexa situated medial to the ovary and lateral to the adjacent uterus. Most suggestive of a dilated hydrosalpinx without significant internal complexity, septation or debris, to suggest superinfection although sterility is not ascertained on imaging. 2. No evidence of ovarian torsion. 3. Malpositioned IUD protruding into the right fundal myometrium. Mild heterogeneity of the uterine myometrium is nonspecific. Electronically Signed   By: Lovena Le M.D.   On: 08/20/2020 23:54   US ABDOMEN LIMITED RUQ (LIVER/GB)  Result Date: 08/20/2020 CLINICAL DATA:  Right upper quadrant pain for 4 days EXAM: ULTRASOUND ABDOMEN LIMITED RIGHT UPPER QUADRANT COMPARISON:  None. FINDINGS: Gallbladder: Multiple shadowing gallstones are identified measuring up to 9 mm in size. No evidence of gallbladder wall thickening or pericholecystic fluid. Negative sonographic Murphy sign. Common bile duct: Diameter: 6 mm Liver: No focal lesion identified. Within normal limits in parenchymal echogenicity. Portal vein is patent on color Doppler imaging with normal direction of blood flow towards the liver. Other: None. IMPRESSION: 1. Cholelithiasis without acute cholecystitis. Electronically Signed   By: Randa Ngo M.D.   On: 08/20/2020 20:44      Rudene Re,  MD 08/21/20 [place], [place], MD 08/21/20 [phone]

## 2020-08-21 NOTE — Discharge Instructions (Signed)
Enfermedad inflamatoria plvica Pelvic Inflammatory Disease  La enfermedad inflamatoria plvica (EIP) es una infeccin en algunos o todos los rganos genitales femeninos. La EIP puede estar en la matriz (tero), en los ovarios, en las trompas de [place]op[place] o en los tejidos cercanos que se encuentran dentro de la zona baja del vientre (pelvis). La EIP puede causar problemas si no se trata. Cules son las causas?  Grmenes (bacterias) que se contagian durante las [employer]. Esta es la causa ms frecuente.  Grmenes en la vagina que no se contagian durante las [employer].  Grmenes que se desplazan desde la vagina o el cuello uterino [employer] rganos genitales despus de alguno de estos sucesos: ? El nacimiento de un beb. ? Un aborto espontneo. ? Un aborto inducido. ? Una ciruga plvica. ? La insercin de un dispositivo intrauterino (DIU). ? Una violacin sexual. Sander Nephew incrementa el riesgo?  Ser menor de 23aos.  Tener relaciones sexuales a una edad temprana.  Haber sufrido alguna ITS (infeccin de transmisin sexual) o EIP.  No usar mtodos anticonceptivos de barrera, como condones.  Tener muchas parejas sexuales.  Tener relaciones sexuales con alguien que tiene sntomas de una infeccin de transmisin sexual (ITS).  Usar duchas vaginales.  Colocarse un DIU. Cules son los signos o los sntomas?  Dolor en la zona del vientre.  Cristy Hilts.  Escalofros.  Secrecin de la vagina que no es normal.  Sangrado del tero que no es normal.  Dolor poco despus del final del perodo menstrual.  Dolor al hacer pis (orinar).  Dolor al [employer].  Malestar estomacal (nuseas) o ganas de devolver (vmitos). Cmo se trata?  Tomar antibiticos. En [employer] graves, pueden administrarse a travs de un tubo (catter) intravenoso.  Ciruga. Esto es poco frecuente.  Esfuerzos para Art gallery manager de la infeccin. Las parejas sexuales  podran Warden/ranger. Pueden pasar semanas hasta que se sienta mejor. El mdico puede repetir las pruebas para Paramedic una infeccin despus de que termine el Myra. Tambin debern realizarle estudios para descartar el VIH (virus de inmunodeficiencia humana). Siga estas instrucciones en su casa:  Tome los medicamentos de venta libre y los recetados solamente como se lo haya indicado el mdico.  Si le recetaron un antibitico, tmelo como se lo haya indicado el mdico. No deje de tomarlo aunque comience a sentirse mejor.  No tenga relaciones sexuales hasta completar el tratamiento o segn las indicaciones del mdico.  Comunique a su compaero sexual que sufre una EIP. Su pareja podra Warden/ranger.  Concurra a todas las visitas de seguimiento como se lo haya indicado el mdico. Esto es importante. Comunquese con un mdico si:  Tiene ms lquido o lquido que no es normal que proviene de la vagina.  El dolor no mejora.  Vomita.  Tiene fiebre.  No puede tomar los medicamentos.  Su pareja tiene una infeccin de transmisin sexual (ITS).  Siente dolor al [employer]. Solicite ayuda inmediatamente si:  Siente ms dolor en la zona del vientre.  Tiene escalofros.  No mejora en 72horas con el tratamiento. Resumen  La enfermedad inflamatoria plvica (EIP) es causada por una infeccin en algunos o todos los rganos genitales femeninos.  La EIP es una infeccin grave.  Esta infeccin a menudo se trata con antibiticos.  No tenga relaciones sexuales hasta completar el tratamiento o segn las indicaciones del mdico. Esta informacin no tiene Marine scientist el consejo del mdico. Asegrese de hacerle al mdico cualquier pregunta que tenga. Document Revised:  03/10/2018 Document Reviewed: 03/10/2018 Elsevier Patient Education  2021 [employer].

## 2020-08-21 NOTE — Discharge Summary (Addendum)
Physician Discharge Summary  Patient ID: Brittany Richards MRN: [id number] DOB/AGE: Jul 15, 1980 40 y.o.  Admit date: 08/20/2020 Discharge date: 08/21/2020  Admission Diagnoses:  Sepsis, hydrosalpinx (right),   Discharge Diagnoses:  Active Problems:   Hydrosalpinx   Cholelithiases   PID (acute pelvic inflammatory disease)   IUD (intrauterine device) in place   Discharged Condition: good  Hospital Course: The patient was admitted from the Emergency Room after initial presentation of upper abdominal and flank pain (mostly right sided) x 5 days, with clinical picture initially being concerning for sepsis (leukocytosis, mild tachycardia, and infectious source) due to PID with hydrosalpinx. Genital culture positive for bacterial vaginosis, negative for gonorrhea and chlamydia.  The patient was treated in the ER with initial dose of IV doxycyline and Flagyl.  During her admission, she was treated with PO antibiotics (Flagyl and doxycyline) as she was able to tolerate PO and remained afebrile.  Her pain was minimal, managed with PO meds.  Urine and blood cultures pending. By HD#1 patient was stable for discharge.  Consults: None  Significant Diagnostic Studies: labs, microbiology and radiology (see below)  Results for orders placed or performed during the hospital encounter of 08/20/20  Blood culture (routine x 2)   Specimen: BLOOD  Result Value Ref Range   Specimen Description BLOOD RIGHT ANTECUBITAL    Special Requests      BOTTLES DRAWN AEROBIC AND ANAEROBIC Blood Culture adequate volume   Culture      NO GROWTH < 12 HOURS Performed at [facility], [address], [place], [place] [address]    Report Status PENDING   Blood culture (routine x 2)   Specimen: BLOOD  Result Value Ref Range   Specimen Description BLOOD LEFT ANTECUBITAL    Special Requests      BOTTLES DRAWN AEROBIC AND ANAEROBIC Blood Culture adequate volume   Culture      NO GROWTH < 12 HOURS Performed at  [facility], [address], [place], [place] [address]    Report Status PENDING   Wet prep, genital   Specimen: Cervix  Result Value Ref Range   Yeast Wet Prep HPF POC NONE SEEN NONE SEEN   Trich, Wet Prep NONE SEEN NONE SEEN   Clue Cells Wet Prep HPF POC PRESENT (A) NONE SEEN   WBC, Wet Prep HPF POC MANY (A) NONE SEEN   Sperm NONE SEEN   Chlamydia/NGC rt PCR ([facility] only)   Specimen: Cervix; GU  Result Value Ref Range   Specimen source GC/Chlam ENDOCERVICAL    Chlamydia Tr NOT DETECTED NOT DETECTED   N gonorrhoeae NOT DETECTED NOT DETECTED  Resp Panel by RT-PCR (Flu A&B, Covid) Nasopharyngeal Swab   Specimen: Nasopharyngeal Swab; Nasopharyngeal(NP) swabs in vial transport medium  Result Value Ref Range   SARS Coronavirus 2 by RT PCR NEGATIVE NEGATIVE   Influenza A by PCR NEGATIVE NEGATIVE   Influenza B by PCR NEGATIVE NEGATIVE  Lipase, blood  Result Value Ref Range   Lipase 41 11 - 51 U/L  Comprehensive metabolic panel  Result Value Ref Range   Sodium 137 135 - 145 mmol/L   Potassium 3.6 3.5 - 5.1 mmol/L   Chloride 103 98 - 111 mmol/L   CO2 23 22 - 32 mmol/L   Glucose, Bld 113 (H) 70 - 99 mg/dL   BUN 13 6 - 20 mg/dL   Creatinine, Ser 0.56 0.44 - 1.00 mg/dL   Calcium 9.7 8.9 - 10.3 mg/dL   Total Protein  7.7 6.5 - 8.1 g/dL   Albumin 4.5 3.5 - 5.0 g/dL   AST 24 15 - 41 U/L   ALT 24 0 - 44 U/L   Alkaline Phosphatase 66 38 - 126 U/L   Total Bilirubin 1.1 0.3 - 1.2 mg/dL   GFR, Estimated >60 >60 mL/min   Anion gap 11 5 - 15  CBC  Result Value Ref Range   WBC 12.9 (H) 4.0 - 10.5 K/uL   RBC 4.18 3.87 - 5.11 MIL/uL   Hemoglobin 13.4 12.0 - 15.0 g/dL   HCT 38.7 36.0 - 46.0 %   MCV 92.6 80.0 - 100.0 fL   MCH 32.1 26.0 - 34.0 pg   MCHC 34.6 30.0 - 36.0 g/dL   RDW 12.4 11.5 - 15.5 %   Platelets 288 150 - 400 K/uL   nRBC 0.0 0.0 - 0.2 %  Urinalysis, Complete w Microscopic  Result Value Ref Range   Color, Urine AMBER (A) YELLOW   APPearance CLOUDY (A) CLEAR    Specific Gravity, Urine 1.009 1.005 - 1.030   pH 6.0 5.0 - 8.0   Glucose, UA NEGATIVE NEGATIVE mg/dL   Hgb urine dipstick NEGATIVE NEGATIVE   Bilirubin Urine NEGATIVE NEGATIVE   Ketones, ur NEGATIVE NEGATIVE mg/dL   Protein, ur NEGATIVE NEGATIVE mg/dL   Nitrite NEGATIVE NEGATIVE   Leukocytes,Ua LARGE (A) NEGATIVE   RBC / HPF 0-5 0 - 5 RBC/hpf   WBC, UA 0-5 0 - 5 WBC/hpf   Bacteria, UA NONE SEEN NONE SEEN   Squamous Epithelial / LPF 0-5 0 - 5  Lactic acid, plasma  Result Value Ref Range   Lactic Acid, Venous 2.0 (HH) 0.5 - 1.9 mmol/L  Lactic acid, plasma  Result Value Ref Range   Lactic Acid, Venous 1.3 0.5 - 1.9 mmol/L  CBC WITH DIFFERENTIAL  Result Value Ref Range   WBC 11.8 (H) 4.0 - 10.5 K/uL   RBC 3.60 (L) 3.87 - 5.11 MIL/uL   Hemoglobin 11.5 (L) 12.0 - 15.0 g/dL   HCT 33.2 (L) 36.0 - 46.0 %   MCV 92.2 80.0 - 100.0 fL   MCH 31.9 26.0 - 34.0 pg   MCHC 34.6 30.0 - 36.0 g/dL   RDW 12.5 11.5 - 15.5 %   Platelets 243 150 - 400 K/uL   nRBC 0.0 0.0 - 0.2 %   Neutrophils Relative % 63 %   Neutro Abs 7.4 1.7 - 7.7 K/uL   Lymphocytes Relative 26 %   Lymphs Abs 3.0 0.7 - 4.0 K/uL   Monocytes Relative 9 %   Monocytes Absolute 1.1 (H) 0.1 - 1.0 K/uL   Eosinophils Relative 2 %   Eosinophils Absolute 0.2 0.0 - 0.5 K/uL   Basophils Relative 0 %   Basophils Absolute 0.0 0.0 - 0.1 K/uL   Immature Granulocytes 0 %   Abs Immature Granulocytes 0.05 0.00 - 0.07 K/uL  POC urine preg, ED  Result Value Ref Range   Preg Test, Ur NEGATIVE NEGATIVE     US PELVIC COMPLETE W TRANSVAGINAL AND TORSION R/O CLINICAL DATA:  Pelvic pain for 5 days, abnormal CT  EXAM: TRANSABDOMINAL AND TRANSVAGINAL ULTRASOUND OF PELVIS  DOPPLER ULTRASOUND OF OVARIES  TECHNIQUE: Both transabdominal and transvaginal ultrasound examinations of the pelvis were performed. Transabdominal technique was performed for global imaging of the pelvis including uterus, ovaries, adnexal regions, and pelvic  cul-de-sac.  It was necessary to proceed with endovaginal exam following the transabdominal exam to visualize the endometrium and ovaries.  Color and duplex Doppler ultrasound was utilized to evaluate blood flow to the ovaries.  COMPARISON:  CT 08/20/2020, 11/27/2019  FINDINGS: Uterus  Measurements: 8.8 x 4.8 x 6.1 cm = volume: 134.5 mL. Mild heterogeneity a nonspecific finding. No focal mass or discernible fibroid.  Endometrium  Thickness: Double stripe thickness of 4.6 mm. Trace amount of endometrial fluid. Echogenic, posteriorly shadowing appears malposition towards the uterine fundus protruding into the right fundal myometrium, better characterized on same day CT and unchanged since comparison imaging 11/27/2019.  Right ovary  Measurements: 3.3 x 1.8 x 1.8 cm = volume: 5.4 mL. Fluid distended tubular structure in the right adnexa measuring approximately 9.7 x 2.8 x 4.0 cm situated medial to the ovary and lateral to the adjacent uterus. Most suggestive of a dilated hydrosalpinx without significant internal complexity, septation or debris, to suggest superinfection although sterility is not ascertained on imaging. Otherwise normal appearance of the right ovary with several normal follicles.  Left ovary  Measurements: 3.7 x 2.2 x 2.9 cm = volume: 12.3 mL. The left ovary is visualized only on transabdominal abdominal imaging. No gross abnormality or adnexal lesion is seen.  Pulsed Doppler evaluation of both ovaries demonstrates normal low-resistance arterial and venous waveforms.  Other findings  No abnormal free fluid.  IMPRESSION: 1. 9.7 x 2.8 x 4.0 cm fluid distended tubular structure in the right adnexa situated medial to the ovary and lateral to the adjacent uterus. Most suggestive of a dilated hydrosalpinx without significant internal complexity, septation or debris, to suggest superinfection although sterility is not ascertained on imaging. 2. No evidence  of ovarian torsion. 3. Malpositioned IUD protruding into the right fundal myometrium. Mild heterogeneity of the uterine myometrium is nonspecific.  Electronically Signed   By: Lovena Le M.D.   On: 08/20/2020 23:54 CT ABDOMEN PELVIS W CONTRAST CLINICAL DATA:  Right sided flank pain  EXAM: CT ABDOMEN AND PELVIS WITH CONTRAST  TECHNIQUE: Multidetector CT imaging of the abdomen and pelvis was performed using the standard protocol following bolus administration of intravenous contrast.  CONTRAST:  10mL OMNIPAQUE IOHEXOL 300 MG/ML  SOLN  COMPARISON:  Ultrasound 08/20/2020, CT 11/27/2019  FINDINGS: Lower chest: Lung bases demonstrate no acute consolidation or effusion.  Hepatobiliary: Subcentimeter hypodensity within the right hepatic lobe too small to further characterize. Gallstone. No biliary dilatation  Pancreas: Unremarkable. No pancreatic ductal dilatation or surrounding inflammatory changes.  Spleen: Normal in size without focal abnormality.  Adrenals/Urinary Tract: Adrenal glands are normal. Subcentimeter hypodensities within the kidneys, too small to further characterize though probably cysts. The bladder is unremarkable. No hydronephrosis. Slightly prominent right ureter with mild urothelial enhancement. No obstructing stone.  Stomach/Bowel: Stomach is within normal limits. Appendix appears normal. No evidence of bowel wall thickening, distention, or inflammatory changes.  Vascular/Lymphatic: No significant vascular findings are present. No enlarged abdominal or pelvic lymph nodes.  Reproductive: Abnormal positioning of intrauterine device with T arms visible at the right fundal myometrium. Interim development of oblong fluid structure in the right adnexa measuring 6.6 cm by 3.2 cm. 2.7 cm left labial cyst.  Other: Negative for free air or free fluid.  Musculoskeletal: No acute or significant osseous findings.  IMPRESSION: 1. Negative for hydronephrosis  or ureteral stone. Slightly prominent right ureter with mild urothelial enhancement, question ascending urinary tract infection. Suggest correlation with urinalysis. 2. Interim development of oblong fluid structure in the right adnexa measuring up to 6.6 cm, appearance suggests hydrosalpinx, less likely large adnexal cyst, correlation with pelvic ultrasound may be  considered. 3. Abnormal positioning of intrauterine device with T arms visible within the right fundal myometrium 4. Gallstone.  Electronically Signed   By: Donavan Foil M.D.   On: 08/20/2020 21:27 US ABDOMEN LIMITED RUQ (LIVER/GB) CLINICAL DATA:  Right upper quadrant pain for 4 days  EXAM: ULTRASOUND ABDOMEN LIMITED RIGHT UPPER QUADRANT  COMPARISON:  None.  FINDINGS: Gallbladder:  Multiple shadowing gallstones are identified measuring up to 9 mm in size. No evidence of gallbladder wall thickening or pericholecystic fluid. Negative sonographic Murphy sign.  Common bile duct:  Diameter: 6 mm  Liver:  No focal lesion identified. Within normal limits in parenchymal echogenicity. Portal vein is patent on color Doppler imaging with normal direction of blood flow towards the liver.  Other: None.  IMPRESSION: 1. Cholelithiasis without acute cholecystitis.  Electronically Signed   By: Randa Ngo M.D.   On: 08/20/2020 20:44    Treatments: IV hydration, antibiotics: metronidazole and doxycyline and analgesia: Percocet/Ibuprofen  Discharge Exam: Blood pressure 120/70, pulse 78, temperature 97.9 F (36.6 C), temperature source Oral, resp. rate 18, height 6\' 2"  (1.88 m), weight 63.5 kg, SpO2 98 %, currently breastfeeding. General appearance: alert and no distress Resp: clear to auscultation bilaterally Cardio: regular rate and rhythm, S1, S2 normal, no murmur, click, rub or gallop GI: normal findings: bowel sounds normal, no masses palpable and soft and abnormal findings:  mild tenderness in the RUQ and in  the RLQ Pelvic: deferred Extremities: extremities normal, atraumatic, no cyanosis or edema Skin: Skin color, texture, turgor normal. No rashes or lesions Neurologic: Grossly normal  Disposition: Discharge disposition: 01-Home or Self Care       Discharge Instructions    Discharge patient   Complete by: As directed    Discharge disposition: 01-Home or Self Care   Discharge patient date: 08/21/2020     Allergies as of 08/21/2020   No Known Allergies     Medication List    STOP taking these medications   ferrous sulfate 325 (65 FE) MG tablet     TAKE these medications   doxycycline 100 MG tablet Commonly known as: VIBRA-TABS Take 1 tablet (100 mg total) by mouth every 12 (twelve) hours. 10 pm and 10 am   ibuprofen 600 MG tablet Commonly known as: ADVIL Take 1 tablet (600 mg total) by mouth every 6 (six) hours as needed (mild pain).   levonorgestrel 20 MCG/24HR IUD Commonly known as: MIRENA 1 each by Intrauterine route once.   metroNIDAZOLE 500 MG tablet Commonly known as: Flagyl Take 1 tablet (500 mg total) by mouth 2 (two) times daily for 7 days.   oxyCODONE-acetaminophen 5-325 MG tablet Commonly known as: PERCOCET/ROXICET Take 1-2 tablets by mouth every 6 (six) hours as needed for severe pain (moderate to severe pain (when tolerating fluids)).       Follow-up Information    Rubie Maid, MD Follow up in 2 week(s).   Specialties: Obstetrics and Gynecology, Radiology Why: f/u ER admission, hydrosalpinx Contact information: [place] [address] [phone]                Discussed with the patient and all questioned fully answered. She will call me if any problems arise.  Approximately 20 minutes spent reviewing hospital course and discharge instructions.    Signed: Rubie Maid, MD [facility] 08/21/2020, 6:05 PM

## 2020-08-21 NOTE — Progress Notes (Signed)
Pt transferred to rm [id number]. Pt complains of 3/10 pain in abdomen that radiates to her spine, vitals within normal limits. Pt educated on how to call for help.

## 2020-08-21 NOTE — H&P (Addendum)
Reason for Consult: Hydrosalpinx Referring Physician: Rudene Re, MD (ER Physician)  Spanish Interpreter via Language Line used for today's encounter.    Brittany Richards is an 40 y.o. [phone] female who presented to the Emergency Room yesterday evening due to complaints of abdominal and flank pain for the past 5 days.  Pain was mostly noted in RUQ and radiated to her back.  She denied fevers, chills, nausea, vomiting, dysuria.  Ultrasound concerning for moderate right hydrosalpinx. Currently also has an IUD in place, rarely having menstrual cycles.  Reports concern about her IUD, notes it has been causing her pain since insertion (01/2019).    Pertinent Gynecological History: Menses: flow is light, intermittent.  Contraception: Mirena IUD Sexually transmitted diseases: no past history Last mammogram: patient has never had one.  Last pap: prior to last pregnancy.   OB History  Gravida Para Term Preterm AB Living  4 3 3  0 1 3  SAB IAB Ectopic Multiple Live Births  1       3    # Outcome Date GA Lbr Len/2nd Weight Sex Delivery Anes PTL Lv  4 SAB           3 Term           2 Term     F Vag-Spont     1 Term               Past Medical History:  Diagnosis Date  . Anemia     Past Surgical History:  Procedure Laterality Date  . 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  Social History:  reports that she has never smoked. She has never used smokeless tobacco. She reports that she does not drink alcohol and does not use drugs.  Allergies: No Known Allergies  No current facility-administered medications on file prior to encounter.   Current Outpatient Medications on File Prior to Encounter  Medication Sig Dispense Refill  . ferrous sulfate 325 (65 FE) MG tablet Take 325 mg by mouth daily with  breakfast.    . levonorgestrel (MIRENA) 20 MCG/24HR IUD 1 each by Intrauterine route once.       Review of Systems  Constitutional: Negative for chills, fever and unexpected weight change.  HENT: Negative.   Eyes: Negative.   Respiratory: Negative.   Cardiovascular: Negative.   Gastrointestinal: Positive for abdominal pain. Negative for constipation, diarrhea, nausea and vomiting.  Endocrine: Negative.   Genitourinary: Positive for flank pain and vaginal discharge. Negative for difficulty urinating and dysuria.  Skin: Negative.   Allergic/Immunologic: Negative.   Neurological: Negative.   Hematological: Negative.   Psychiatric/Behavioral: Negative.     Blood pressure (!) 141/88, pulse 75, temperature 98.3 F (36.8 C), temperature source Oral, resp. rate 18, height 6\' 2"  (1.88 m), weight 63.5 kg, SpO2 98 %, currently breastfeeding. Physical Exam Constitutional:      General: She is not in acute distress.    Appearance: She is well-developed.  HENT:     Head: Normocephalic and atraumatic.     Mouth/Throat:     Mouth: Mucous membranes are moist.     Pharynx: Oropharynx is clear.  Cardiovascular:     Rate and Rhythm: Normal rate and regular rhythm.  Pulmonary:     Effort: Pulmonary effort is normal.  Breath sounds: Normal breath sounds.  Abdominal:     General: Abdomen is flat. Bowel sounds are normal. There is no distension.     Palpations: Abdomen is soft.     Tenderness: There is abdominal tenderness in the right upper quadrant and right lower quadrant. There is no right CVA tenderness, left CVA tenderness, guarding or rebound.  Genitourinary:    Comments: Deferred.  See ER Physician note for details of pelvic exam. Skin:    General: Skin is warm and dry.  Neurological:     General: No focal deficit present.     Mental Status: She is alert.  Psychiatric:        Mood and Affect: Mood normal.        Behavior: Behavior normal.     Results for orders placed or  performed during the hospital encounter of 08/20/20 (from the past 48 hour(s))  Lipase, blood     Status: None   Collection Time: 08/20/20  7:07 PM  Result Value Ref Range   Lipase 41 11 - 51 U/L    Comment: Performed at [facility], [address] [place], [place] [address]  Comprehensive metabolic panel     Status: Abnormal   Collection Time: 08/20/20  7:07 PM  Result Value Ref Range   Sodium 137 135 - 145 mmol/L   Potassium 3.6 3.5 - 5.1 mmol/L   Chloride 103 98 - 111 mmol/L   CO2 23 22 - 32 mmol/L   Glucose, Bld 113 (H) 70 - 99 mg/dL    Comment: Glucose reference range applies only to samples taken after fasting for at least 8 hours.   BUN 13 6 - 20 mg/dL   Creatinine, Ser 0.56 0.44 - 1.00 mg/dL   Calcium 9.7 8.9 - 10.3 mg/dL   Total Protein 7.7 6.5 - 8.1 g/dL   Albumin 4.5 3.5 - 5.0 g/dL   AST 24 15 - 41 U/L   ALT 24 0 - 44 U/L   Alkaline Phosphatase 66 38 - 126 U/L   Total Bilirubin 1.1 0.3 - 1.2 mg/dL   GFR, Estimated >60 >60 mL/min    Comment: (NOTE) Calculated using the CKD-EPI Creatinine Equation (2021)    Anion gap 11 5 - 15    Comment: Performed at [facility], [address] [place], [place] [address]  CBC     Status: Abnormal   Collection Time: 08/20/20  7:07 PM  Result Value Ref Range   WBC 12.9 (H) 4.0 - 10.5 K/uL   RBC 4.18 3.87 - 5.11 MIL/uL   Hemoglobin 13.4 12.0 - 15.0 g/dL   HCT 38.7 36.0 - 46.0 %   MCV 92.6 80.0 - 100.0 fL   MCH 32.1 26.0 - 34.0 pg   MCHC 34.6 30.0 - 36.0 g/dL   RDW 12.4 11.5 - 15.5 %   Platelets 288 150 - 400 K/uL   nRBC 0.0 0.0 - 0.2 %    Comment: Performed at [facility], [address] [place],  [address]  Urinalysis, Complete w Microscopic     Status: Abnormal   Collection Time: 08/20/20  7:07 PM  Result Value Ref Range   Color, Urine AMBER (A) YELLOW    Comment: BIOCHEMICALS MAY BE AFFECTED BY COLOR   APPearance CLOUDY (A) CLEAR   Specific Gravity, Urine 1.009 1.005 - 1.030   pH  6.0 5.0 - 8.0   Glucose, UA NEGATIVE NEGATIVE mg/dL   Hgb urine dipstick NEGATIVE NEGATIVE  Bilirubin Urine NEGATIVE NEGATIVE   Ketones, ur NEGATIVE NEGATIVE mg/dL   Protein, ur NEGATIVE NEGATIVE mg/dL   Nitrite NEGATIVE NEGATIVE   Leukocytes,Ua LARGE (A) NEGATIVE   RBC / HPF 0-5 0 - 5 RBC/hpf   WBC, UA 0-5 0 - 5 WBC/hpf   Bacteria, UA NONE SEEN NONE SEEN   Squamous Epithelial / LPF 0-5 0 - 5    Comment: Performed at [facility], [address] [place], [place] [address]  POC urine preg, ED     Status: None   Collection Time: 08/20/20  7:09 PM  Result Value Ref Range   Preg Test, Ur NEGATIVE NEGATIVE    Comment:        THE SENSITIVITY OF THIS METHODOLOGY IS >24 mIU/mL   Lactic acid, plasma     Status: Abnormal   Collection Time: 08/20/20  9:56 PM  Result Value Ref Range   Lactic Acid, Venous 2.0 (HH) 0.5 - 1.9 mmol/L    Comment: CRITICAL RESULT CALLED TO, READ BACK BY AND VERIFIED WITH HALEY HAMILTON @2241  ON 08/20/20 SKL Performed at [facility], [address], [place], [place] [address]   Blood culture (routine x 2)     Status: None (Preliminary result)   Collection Time: 08/20/20  9:56 PM   Specimen: BLOOD  Result Value Ref Range   Specimen Description BLOOD RIGHT ANTECUBITAL    Special Requests      BOTTLES DRAWN AEROBIC AND ANAEROBIC Blood Culture adequate volume   Culture      NO GROWTH < 12 HOURS Performed at [facility], [address], [place], [place] [address]    Report Status PENDING   Blood culture (routine x 2)     Status: None (Preliminary result)   Collection Time: 08/20/20  9:56 PM   Specimen: BLOOD  Result Value Ref Range   Specimen Description BLOOD LEFT ANTECUBITAL    Special Requests      BOTTLES DRAWN AEROBIC AND ANAEROBIC Blood Culture adequate volume   Culture      NO GROWTH < 12 HOURS Performed at [facility], [address] [place], [place] [address]    Report Status PENDING   Wet prep,  genital     Status: Abnormal   Collection Time: 08/20/20  9:56 PM   Specimen: Cervix  Result Value Ref Range   Yeast Wet Prep HPF POC NONE SEEN NONE SEEN   Trich, Wet Prep NONE SEEN NONE SEEN   Clue Cells Wet Prep HPF POC PRESENT (A) NONE SEEN   WBC, Wet Prep HPF POC MANY (A) NONE SEEN   Sperm NONE SEEN     Comment: Performed at [facility], [address], [place], [place] [address]  Roberta rt PCR [facility] only)     Status: None   Collection Time: 08/20/20  9:56 PM   Specimen: Cervix; GU  Result Value Ref Range   Specimen source GC/Chlam ENDOCERVICAL    Chlamydia Tr NOT DETECTED NOT DETECTED   N gonorrhoeae NOT DETECTED NOT DETECTED    Comment: (NOTE) This CT/NG assay has not been evaluated in patients with a history of  hysterectomy. Performed at [facility], [address] [place], [place] [address]   Lactic acid, plasma     Status: None   Collection Time: 08/21/20 12:56 AM  Result Value Ref Range   Lactic Acid, Venous 1.3 0.5 - 1.9 mmol/L    Comment: Performed at [facility], [address] [place], [place] [address]  Resp Panel by RT-PCR (Flu A&B, Covid) Nasopharyngeal Swab     Status: None   Collection Time: 08/21/20 12:56 AM   Specimen: Nasopharyngeal Swab; Nasopharyngeal(NP) swabs in vial transport medium  Result Value Ref Range   SARS Coronavirus 2 by RT PCR NEGATIVE NEGATIVE    Comment: (NOTE) SARS-CoV-2 target nucleic acids are NOT DETECTED.  The SARS-CoV-2 RNA is generally detectable in upper respiratory specimens during the acute phase of infection. The lowest concentration of SARS-CoV-2 viral copies this assay can detect is 138 copies/mL. A negative result does not preclude SARS-Cov-2 infection and should not be used as the sole basis for treatment or other patient management decisions. A negative result may occur with  improper specimen collection/handling, submission of specimen other than nasopharyngeal swab, presence  of viral mutation(s) within the areas targeted by this assay, and inadequate number of viral copies(<138 copies/mL). A negative result must be combined with clinical observations, patient history, and epidemiological information. The expected result is Negative.  Fact Sheet for Patients:  EntrepreneurPulse.com.au  Fact Sheet for Healthcare Providers:  IncredibleEmployment.be  This test is no t yet approved or cleared by the [place] FDA and  has been authorized for detection and/or diagnosis of SARS-CoV-2 by FDA under an Emergency Use Authorization (EUA). This EUA will remain  in effect (meaning this test can be used) for the duration of the COVID-19 declaration under Section 564(b)(1) of the Act, 21 U.S.C.section 360bbb-3(b)(1), unless the authorization is terminated  or revoked sooner.       Influenza A by PCR NEGATIVE NEGATIVE   Influenza B by PCR NEGATIVE NEGATIVE    Comment: (NOTE) The Xpert Xpress SARS-CoV-2/FLU/RSV plus assay is intended as an aid in the diagnosis of influenza from Nasopharyngeal swab specimens and should not be used as a sole basis for treatment. Nasal washings and aspirates are unacceptable for Xpert Xpress SARS-CoV-2/FLU/RSV testing.  Fact Sheet for Patients: EntrepreneurPulse.com.au  Fact Sheet for Healthcare Providers: IncredibleEmployment.be  This test is not yet approved or cleared by the [place] FDA and has been authorized for detection and/or diagnosis of SARS-CoV-2 by FDA under an Emergency Use Authorization (EUA). This EUA will remain in effect (meaning this test can be used) for the duration of the COVID-19 declaration under Section 564(b)(1) of the Act, 21 U.S.C. section 360bbb-3(b)(1), unless the authorization is terminated or revoked.  Performed at [facility], [address] [place], [place] [address]   CBC WITH DIFFERENTIAL     Status:  Abnormal   Collection Time: 08/21/20  6:25 AM  Result Value Ref Range   WBC 11.8 (H) 4.0 - 10.5 K/uL   RBC 3.60 (L) 3.87 - 5.11 MIL/uL   Hemoglobin 11.5 (L) 12.0 - 15.0 g/dL   HCT 33.2 (L) 36.0 - 46.0 %   MCV 92.2 80.0 - 100.0 fL   MCH 31.9 26.0 - 34.0 pg   MCHC 34.6 30.0 - 36.0 g/dL   RDW 12.5 11.5 - 15.5 %   Platelets 243 150 - 400 K/uL   nRBC 0.0 0.0 - 0.2 %   Neutrophils Relative % 63 %   Neutro Abs 7.4 1.7 - 7.7 K/uL   Lymphocytes Relative 26 %   Lymphs Abs 3.0 0.7 - 4.0 K/uL   Monocytes Relative 9 %   Monocytes Absolute 1.1 (H) 0.1 - 1.0 K/uL   Eosinophils Relative 2 %   Eosinophils Absolute 0.2 0.0 - 0.5 K/uL   Basophils Relative 0 %  Basophils Absolute 0.0 0.0 - 0.1 K/uL   Immature Granulocytes 0 %   Abs Immature Granulocytes 0.05 0.00 - 0.07 K/uL    Comment: Performed at [facility], [address] [place], [place] [address]    CT ABDOMEN PELVIS W CONTRAST  Result Date: 08/20/2020 CLINICAL DATA:  Right sided flank pain EXAM: CT ABDOMEN AND PELVIS WITH CONTRAST TECHNIQUE: Multidetector CT imaging of the abdomen and pelvis was performed using the standard protocol following bolus administration of intravenous contrast. CONTRAST:  3mL OMNIPAQUE IOHEXOL 300 MG/ML  SOLN COMPARISON:  Ultrasound 08/20/2020, CT 11/27/2019 FINDINGS: Lower chest: Lung bases demonstrate no acute consolidation or effusion. Hepatobiliary: Subcentimeter hypodensity within the right hepatic lobe too small to further characterize. Gallstone. No biliary dilatation Pancreas: Unremarkable. No pancreatic ductal dilatation or surrounding inflammatory changes. Spleen: Normal in size without focal abnormality. Adrenals/Urinary Tract: Adrenal glands are normal. Subcentimeter hypodensities within the kidneys, too small to further characterize though probably cysts. The bladder is unremarkable. No hydronephrosis. Slightly prominent right ureter with mild urothelial enhancement. No obstructing stone.  Stomach/Bowel: Stomach is within normal limits. Appendix appears normal. No evidence of bowel wall thickening, distention, or inflammatory changes. Vascular/Lymphatic: No significant vascular findings are present. No enlarged abdominal or pelvic lymph nodes. Reproductive: Abnormal positioning of intrauterine device with T arms visible at the right fundal myometrium. Interim development of oblong fluid structure in the right adnexa measuring 6.6 cm by 3.2 cm. 2.7 cm left labial cyst. Other: Negative for free air or free fluid. Musculoskeletal: No acute or significant osseous findings. IMPRESSION: 1. Negative for hydronephrosis or ureteral stone. Slightly prominent right ureter with mild urothelial enhancement, question ascending urinary tract infection. Suggest correlation with urinalysis. 2. Interim development of oblong fluid structure in the right adnexa measuring up to 6.6 cm, appearance suggests hydrosalpinx, less likely large adnexal cyst, correlation with pelvic ultrasound may be considered. 3. Abnormal positioning of intrauterine device with T arms visible within the right fundal myometrium 4. Gallstone. Electronically Signed   By: Donavan Foil M.D.   On: 08/20/2020 21:27   US PELVIC COMPLETE W TRANSVAGINAL AND TORSION R/O  Result Date: 08/20/2020 CLINICAL DATA:  Pelvic pain for 5 days, abnormal CT EXAM: TRANSABDOMINAL AND TRANSVAGINAL ULTRASOUND OF PELVIS DOPPLER ULTRASOUND OF OVARIES TECHNIQUE: Both transabdominal and transvaginal ultrasound examinations of the pelvis were performed. Transabdominal technique was performed for global imaging of the pelvis including uterus, ovaries, adnexal regions, and pelvic cul-de-sac. It was necessary to proceed with endovaginal exam following the transabdominal exam to visualize the endometrium and ovaries. Color and duplex Doppler ultrasound was utilized to evaluate blood flow to the ovaries. COMPARISON:  CT 08/20/2020, 11/27/2019 FINDINGS: Uterus Measurements: 8.8  x 4.8 x 6.1 cm = volume: 134.5 mL. Mild heterogeneity a nonspecific finding. No focal mass or discernible fibroid. Endometrium Thickness: Double stripe thickness of 4.6 mm. Trace amount of endometrial fluid. Echogenic, posteriorly shadowing appears malposition towards the uterine fundus protruding into the right fundal myometrium, better characterized on same day CT and unchanged since comparison imaging 11/27/2019. Right ovary Measurements: 3.3 x 1.8 x 1.8 cm = volume: 5.4 mL. Fluid distended tubular structure in the right adnexa measuring approximately 9.7 x 2.8 x 4.0 cm situated medial to the ovary and lateral to the adjacent uterus. Most suggestive of a dilated hydrosalpinx without significant internal complexity, septation or debris, to suggest superinfection although sterility is not ascertained on imaging. Otherwise normal appearance of the right ovary with several normal follicles. Left ovary Measurements: 3.7 x  2.2 x 2.9 cm = volume: 12.3 mL. The left ovary is visualized only on transabdominal abdominal imaging. No gross abnormality or adnexal lesion is seen. Pulsed Doppler evaluation of both ovaries demonstrates normal low-resistance arterial and venous waveforms. Other findings No abnormal free fluid. IMPRESSION: 1. 9.7 x 2.8 x 4.0 cm fluid distended tubular structure in the right adnexa situated medial to the ovary and lateral to the adjacent uterus. Most suggestive of a dilated hydrosalpinx without significant internal complexity, septation or debris, to suggest superinfection although sterility is not ascertained on imaging. 2. No evidence of ovarian torsion. 3. Malpositioned IUD protruding into the right fundal myometrium. Mild heterogeneity of the uterine myometrium is nonspecific. Electronically Signed   By: Lovena Le M.D.   On: 08/20/2020 23:54   US ABDOMEN LIMITED RUQ (LIVER/GB)  Result Date: 08/20/2020 CLINICAL DATA:  Right upper quadrant pain for 4 days EXAM: ULTRASOUND ABDOMEN LIMITED  RIGHT UPPER QUADRANT COMPARISON:  None. FINDINGS: Gallbladder: Multiple shadowing gallstones are identified measuring up to 9 mm in size. No evidence of gallbladder wall thickening or pericholecystic fluid. Negative sonographic Murphy sign. Common bile duct: Diameter: 6 mm Liver: No focal lesion identified. Within normal limits in parenchymal echogenicity. Portal vein is patent on color Doppler imaging with normal direction of blood flow towards the liver. Other: None. IMPRESSION: 1. Cholelithiasis without acute cholecystitis. Electronically Signed   By: Randa Ngo M.D.   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 Has IUD in place.   - Patient with negative GC/Cl cultures, but wet prep positive for BV. Not likely cause of the hydrosalpinx however will continue to treat. Received one dose of IV antibiotics (Flagyl) in ER, will transition to PO course as patient able to tolerate.  - Hydrosalpinx, moderate size.  Will treat with course of Doxycyline. To follow up ultrasound in 4-6 weeks to ensure resolution due to size.   2) IUD in place   - Patient reports that her IUD has been causing her problems since insertion. Mostly experiencing pain.  Would like to have IUD removed while inpatient, however on further discussion, she reports that the last provider was unable to visualize her strings.  Discussed that we could attempt to remove her strings in office after the infection was treated, as she would likely require instrumentation to help retrieve the IUD and could push her current infection higher if done at this time. Will remove in office in 2 weeks. Advised that if strings were still difficult to remove, may have to consider surgical removal with hysteroscopic guidance.   3) Sepsis   - Patient identified as sepsis in Emergency Room based on labs (elevated WBC count and lactic acid, source of infection in the pelvis).  However, patient has been afebrile, even prior  to admission, and pain has been managed with PO meds since admission. She also has not experienced any nausea or vomiting which can allow for home treatment with PO meds. Lactic acid and WBC count were borderline or mildly elevated, and tachycardia was also borderline (nothing higher than HR of 107 during her admission, which has since resolved).   Blood and urine cultures pending, however I believe that patient physically does not appear septic. She is able to discharge home later today after further observation.   4) Gallstones  - Patient with gallstones noted on recent ultrasound. No prior history prior to today's presentation.  No evidence of cholecystitis on scan or labs.  Can manage  with surgery in the future if active flare, however for now can manage with dietary changes.   Rubie Maid, MD [facility] 08/21/2020

## 2020-08-21 NOTE — Progress Notes (Signed)
Pt discharged home.  Discharge instructions, prescriptions and follow up appointment given to and reviewed with pt.  Pt verbalized understanding.  Escorted by auxillary. 

## 2020-08-22 LAB — URINE CULTURE: Culture: 80000 — AB

## 2020-08-25 LAB — CULTURE, BLOOD (ROUTINE X 2)
Culture: NO GROWTH
Culture: NO GROWTH
Special Requests: ADEQUATE
Special Requests: ADEQUATE

## 2020-09-09 ENCOUNTER — Encounter: Payer: Self-pay | Admitting: Obstetrics and Gynecology

## 2020-09-18 ENCOUNTER — Encounter: Payer: Self-pay | Admitting: *Deleted

## 2020-09-23 ENCOUNTER — Encounter: Payer: Self-pay | Admitting: Obstetrics and Gynecology

## 2020-09-23 ENCOUNTER — Other Ambulatory Visit: Payer: Self-pay

## 2020-09-23 ENCOUNTER — Ambulatory Visit: Payer: Self-pay | Admitting: Obstetrics and Gynecology

## 2020-09-23 VITALS — BP 152/92 | HR 111 | Ht 62.0 in | Wt 141.9 lb

## 2020-09-23 DIAGNOSIS — Z30432 Encounter for removal of intrauterine contraceptive device: Secondary | ICD-10-CM

## 2020-09-23 DIAGNOSIS — N7011 Chronic salpingitis: Secondary | ICD-10-CM

## 2020-09-23 DIAGNOSIS — R030 Elevated blood-pressure reading, without diagnosis of hypertension: Secondary | ICD-10-CM

## 2020-09-23 NOTE — Progress Notes (Signed)
GYNECOLOGY PROGRESS NOTE  Subjective:    Patient ID: Brittany Richards, female    DOB: [birth year], 40 y.o.   MRN: [id number]  Spanish Interpreter present for today's visit  HPI  Patient is a 40 y.o. [id number] female who presents for follow up from hospital admission on 08/20/2020 for right hydrosalpinx and suspected PID (although later cultures only revealed + clue cells suggestive of BV infection). Patient reports completion of antibiotics, Doxycycline without any issues. Denies any further abdominal pain.   Of note, patient at time of her admission noted that she desired removal of her IUD, as she had also been experiencing pain and irregularities with her menstrual cycles for the past year or so.    The following portions of the patient's history were reviewed and updated as appropriate:  She  has a past medical history of Anemia and Hydrosalpinx.   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   Her family history includes Healthy in her father; Heart attack in her mother; Hypertension in her mother.   She  reports that she has never smoked. She has never used smokeless tobacco. She reports that she does not drink alcohol and does not use drugs.   She has a current medication list which includes the following prescription(s): doxycycline, ibuprofen, levonorgestrel, and oxycodone-acetaminophen.   Current Outpatient Medications on File Prior to Visit  Medication Sig Dispense Refill  . doxycycline (VIBRA-TABS) 100 MG tablet Take 1 tablet (100 mg total) by mouth every 12 (twelve) hours. 10 pm and 10 am 13 tablet 0  . ibuprofen (ADVIL) 600 MG tablet Take 1 tablet (600 mg total) by mouth every 6 (six) hours as needed (mild pain). 30 tablet 0  . levonorgestrel (MIRENA) 20 MCG/24HR IUD 1 each by Intrauterine route once.    Marland Kitchen oxyCODONE-acetaminophen (PERCOCET/ROXICET) 5-325 MG tablet Take 1-2 tablets by mouth every 6 (six) hours as needed  for severe pain (moderate to severe pain (when tolerating fluids)). 15 tablet 0   No current facility-administered medications on file prior to visit.   She has No Known Allergies..  Review of Systems Pertinent items noted in HPI and remainder of comprehensive ROS otherwise negative.   Objective:   Blood pressure (!) 152/92, pulse (!) 111, height 5\' 2"  (1.575 m), weight 141 lb 14.4 oz (64.4 kg), currently breastfeeding. General appearance: alert and no distress Abdomen: soft, non-tender; bowel sounds normal; no masses,  no organomegaly Pelvic: external genitalia normal, rectovaginal septum normal.  Vagina without discharge.  Cervix normal appearing, no lesions and no motion tenderness. IUD threads visible, ~ 3 cm in length. Uterus mobile, nontender, normal shape and size.  Adnexae non-palpable, nontender bilaterally.  Extremities: extremities normal, atraumatic, no cyanosis or edema Neurologic: Grossly normal    . Admission on 08/20/2020, Discharged on 08/21/2020  Component Date Value Ref Range Status  . Lipase 08/20/2020 41  11 - 51 U/L Final   Performed at [facility], [address] [place], [place] [address]  . Sodium 08/20/2020 137  135 - 145 mmol/L Final  . Potassium 08/20/2020 3.6  3.5 - 5.1 mmol/L Final  . Chloride 08/20/2020 103  98 - 111 mmol/L Final  . CO2 08/20/2020 23  22 - 32 mmol/L Final  . Glucose, Bld 08/20/2020 113* 70 - 99 mg/dL Final   Glucose reference range applies only to samples taken after fasting for at least 8 hours.  . BUN 08/20/2020 13  6 -  20 mg/dL Final  . Creatinine, Ser 08/20/2020 0.56  0.44 - 1.00 mg/dL Final  . Calcium 08/20/2020 9.7  8.9 - 10.3 mg/dL Final  . Total Protein 08/20/2020 7.7  6.5 - 8.1 g/dL Final  . Albumin 08/20/2020 4.5  3.5 - 5.0 g/dL Final  . AST 08/20/2020 24  15 - 41 U/L Final  . ALT 08/20/2020 24  0 - 44 U/L Final  . Alkaline Phosphatase 08/20/2020 66  38 - 126 U/L Final  . Total Bilirubin 08/20/2020 1.1  0.3 -  1.2 mg/dL Final  . GFR, Estimated 08/20/2020 >60  >60 mL/min Final   Comment: (NOTE) Calculated using the CKD-EPI Creatinine Equation (2021)   . Anion gap 08/20/2020 11  5 - 15 Final   Performed at [facility], [address] [place], [place] [address]  . WBC 08/20/2020 12.9* 4.0 - 10.5 K/uL Final  . RBC 08/20/2020 4.18  3.87 - 5.11 MIL/uL Final  . Hemoglobin 08/20/2020 13.4  12.0 - 15.0 g/dL Final  . HCT 08/20/2020 38.7  36.0 - 46.0 % Final  . MCV 08/20/2020 92.6  80.0 - 100.0 fL Final  . MCH 08/20/2020 32.1  26.0 - 34.0 pg Final  . MCHC 08/20/2020 34.6  30.0 - 36.0 g/dL Final  . RDW 08/20/2020 12.4  11.5 - 15.5 % Final  . Platelets 08/20/2020 288  150 - 400 K/uL Final  . nRBC 08/20/2020 0.0  0.0 - 0.2 % Final   Performed at [facility], [address], [place], [place] [address]  . Color, Urine 08/20/2020 AMBER* YELLOW Final   BIOCHEMICALS MAY BE AFFECTED BY COLOR  . APPearance 08/20/2020 CLOUDY* CLEAR Final  . Specific Gravity, Urine 08/20/2020 1.009  1.005 - 1.030 Final  . pH 08/20/2020 6.0  5.0 - 8.0 Final  . Glucose, UA 08/20/2020 NEGATIVE  NEGATIVE mg/dL Final  . Hgb urine dipstick 08/20/2020 NEGATIVE  NEGATIVE Final  . Bilirubin Urine 08/20/2020 NEGATIVE  NEGATIVE Final  . Ketones, ur 08/20/2020 NEGATIVE  NEGATIVE mg/dL Final  . Protein, ur 08/20/2020 NEGATIVE  NEGATIVE mg/dL Final  . Nitrite 08/20/2020 NEGATIVE  NEGATIVE Final  . Leukocytes,Ua 08/20/2020 LARGE* NEGATIVE Final  . RBC / HPF 08/20/2020 0-5  0 - 5 RBC/hpf Final  . WBC, UA 08/20/2020 0-5  0 - 5 WBC/hpf Final  . Bacteria, UA 08/20/2020 NONE SEEN  NONE SEEN Final  . Squamous Epithelial / LPF 08/20/2020 0-5  0 - 5 Final   Performed at [facility], [address], [place], [place] [address]  . Preg Test, Ur 08/20/2020 NEGATIVE  NEGATIVE Final   Comment:        THE SENSITIVITY OF THIS METHODOLOGY IS >24 mIU/mL   . Specimen Description 08/20/2020    Final                    Value:URINE, RANDOM Performed at [facility], [address] [place], [place] [address]   . Special Requests 08/20/2020    Final                   Value:NONE Performed at [facility], [address] [place],  [address]   . Culture 08/20/2020 *  Final                   Value:80,000 COLONIES/mL LACTOBACILLUS SPECIES Standardized susceptibility testing for this organism is not available. Performed at [facility], [address] [place],  [place] [address]   . Report Status 08/20/2020 08/22/2020 FINAL   Final  . Lactic Acid, Venous 08/20/2020 2.0* 0.5 - 1.9 mmol/L Final   Comment: CRITICAL RESULT CALLED TO, READ BACK BY AND VERIFIED WITH HALEY HAMILTON @2241  ON 08/20/20 SKL Performed at [facility], [address], [place], [place] [address]   . Lactic Acid, Venous 08/21/2020 1.3  0.5 - 1.9 mmol/L Final   Performed at [facility], [address] [place], [place] [address]  . Specimen Description 08/20/2020 BLOOD RIGHT ANTECUBITAL   Final  . Special Requests 08/20/2020 BOTTLES DRAWN AEROBIC AND ANAEROBIC Blood Culture adequate volume   Final  . Culture 08/20/2020    Final                   Value:NO GROWTH 5 DAYS Performed at [facility], [address] [place], [place] [address]   . Report Status 08/20/2020 08/25/2020 FINAL   Final  . Specimen Description 08/20/2020 BLOOD LEFT ANTECUBITAL   Final  . Special Requests 08/20/2020 BOTTLES DRAWN AEROBIC AND ANAEROBIC Blood Culture adequate volume   Final  . Culture 08/20/2020    Final                   Value:NO GROWTH 5 DAYS Performed at [facility], [address], [place], [place] [address]   . Report Status 08/20/2020 08/25/2020 FINAL   Final  . Yeast Wet Prep HPF POC 08/20/2020 NONE SEEN  NONE SEEN Final  . Trich, Wet Prep 08/20/2020 NONE SEEN  NONE SEEN Final  . Clue Cells Wet Prep HPF POC 08/20/2020 PRESENT* NONE SEEN Final  . WBC, Wet Prep HPF POC  08/20/2020 MANY* NONE SEEN Final  . Sperm 08/20/2020 NONE SEEN   Final   Performed at [facility], [address] [place], [place] [address]  . Specimen source GC/Chlam 08/20/2020 ENDOCERVICAL   Final  . Chlamydia Tr 08/20/2020 NOT DETECTED  NOT DETECTED Final  . N gonorrhoeae 08/20/2020 NOT DETECTED  NOT DETECTED Final   Comment: (NOTE) This CT/NG assay has not been evaluated in patients with a history of  hysterectomy. Performed at [facility], [address] [place],  [address]   . SARS Coronavirus 2 by RT PCR 08/21/2020 NEGATIVE  NEGATIVE Final   Comment: (NOTE) SARS-CoV-2 target nucleic acids are NOT DETECTED.  The SARS-CoV-2 RNA is generally detectable in upper respiratory specimens during the acute phase of infection. The lowest concentration of SARS-CoV-2 viral copies this assay can detect is 138 copies/mL. A negative result does not preclude SARS-Cov-2 infection and should not be used as the sole basis for treatment or other patient management decisions. A negative result may occur with  improper specimen collection/handling, submission of specimen other than nasopharyngeal swab, presence of viral mutation(s) within the areas targeted by this assay, and inadequate number of viral copies(<138 copies/mL). A negative result must be combined with clinical observations, patient history, and epidemiological information. The expected result is Negative.  Fact Sheet for Patients:  EntrepreneurPulse.com.au  Fact Sheet for Healthcare Providers:  IncredibleEmployment.be  This test is no                          t yet approved or cleared by the [place] FDA and  has been authorized for detection and/or diagnosis of SARS-CoV-2 by FDA under an Emergency Use Authorization (EUA). This EUA will remain  in effect (meaning  this test can be used) for the duration of the COVID-19 declaration under Section 564(b)(1) of  the Act, 21 U.S.C.section 360bbb-3(b)(1), unless the authorization is terminated  or revoked sooner.      . Influenza A by PCR 08/21/2020 NEGATIVE  NEGATIVE Final  . Influenza B by PCR 08/21/2020 NEGATIVE  NEGATIVE Final   Comment: (NOTE) The Xpert Xpress SARS-CoV-2/FLU/RSV plus assay is intended as an aid in the diagnosis of influenza from Nasopharyngeal swab specimens and should not be used as a sole basis for treatment. Nasal washings and aspirates are unacceptable for Xpert Xpress SARS-CoV-2/FLU/RSV testing.  Fact Sheet for Patients: EntrepreneurPulse.com.au  Fact Sheet for Healthcare Providers: IncredibleEmployment.be  This test is not yet approved or cleared by the [place] FDA and has been authorized for detection and/or diagnosis of SARS-CoV-2 by FDA under an Emergency Use Authorization (EUA). This EUA will remain in effect (meaning this test can be used) for the duration of the COVID-19 declaration under Section 564(b)(1) of the Act, 21 U.S.C. section 360bbb-3(b)(1), unless the authorization is terminated or revoked.  Performed at [id number], [address] [place], [place] [address]   . WBC 08/21/2020 11.8* 4.0 - 10.5 K/uL Final  . RBC 08/21/2020 3.60* 3.87 - 5.11 MIL/uL Final  . Hemoglobin 08/21/2020 11.5* 12.0 - 15.0 g/dL Final  . HCT 08/21/2020 33.2* 36.0 - 46.0 % Final  . MCV 08/21/2020 92.2  80.0 - 100.0 fL Final  . MCH 08/21/2020 31.9  26.0 - 34.0 pg Final  . MCHC 08/21/2020 34.6  30.0 - 36.0 g/dL Final  . RDW 08/21/2020 12.5  11.5 - 15.5 % Final  . Platelets 08/21/2020 243  150 - 400 K/uL Final  . nRBC 08/21/2020 0.0  0.0 - 0.2 % Final  . Neutrophils Relative % 08/21/2020 63  % Final  . Neutro Abs 08/21/2020 7.4  1.7 - 7.7 K/uL Final  . Lymphocytes Relative 08/21/2020 26  % Final  . Lymphs Abs 08/21/2020 3.0  0.7 - 4.0 K/uL Final  . Monocytes Relative 08/21/2020 9  % Final  . Monocytes Absolute  08/21/2020 1.1* 0.1 - 1.0 K/uL Final  . Eosinophils Relative 08/21/2020 2  % Final  . Eosinophils Absolute 08/21/2020 0.2  0.0 - 0.5 K/uL Final  . Basophils Relative 08/21/2020 0  % Final  . Basophils Absolute 08/21/2020 0.0  0.0 - 0.1 K/uL Final  . Immature Granulocytes 08/21/2020 0  % Final  . Abs Immature Granulocytes 08/21/2020 0.05  0.00 - 0.07 K/uL Final   Performed at [facility], [place], [place] [address]     Imaging:  US PELVIC COMPLETE W TRANSVAGINAL AND TORSION R/O CLINICAL DATA:  Pelvic pain for 5 days, abnormal CT  EXAM: TRANSABDOMINAL AND TRANSVAGINAL ULTRASOUND OF PELVIS  DOPPLER ULTRASOUND OF OVARIES  TECHNIQUE: Both transabdominal and transvaginal ultrasound examinations of the pelvis were performed. Transabdominal technique was performed for global imaging of the pelvis including uterus, ovaries, adnexal regions, and pelvic cul-de-sac.  It was necessary to proceed with endovaginal exam following the transabdominal exam to visualize the endometrium and ovaries. Color and duplex Doppler ultrasound was utilized to evaluate blood flow to the ovaries.  COMPARISON:  CT 08/20/2020, 11/27/2019  FINDINGS: Uterus  Measurements: 8.8 x 4.8 x 6.1 cm = volume: 134.5 mL. Mild heterogeneity a nonspecific finding. No focal mass or discernible fibroid.  Endometrium  Thickness: Double stripe thickness of 4.6 mm. Trace amount of endometrial fluid. Echogenic, posteriorly shadowing appears malposition towards  the uterine fundus protruding into the right fundal myometrium, better characterized on same day CT and unchanged since comparison imaging 11/27/2019.  Right ovary  Measurements: 3.3 x 1.8 x 1.8 cm = volume: 5.4 mL. Fluid distended tubular structure in the right adnexa measuring approximately 9.7 x 2.8 x 4.0 cm situated medial to the ovary and lateral to the adjacent uterus. Most suggestive of a dilated hydrosalpinx  without significant internal complexity, septation or debris, to suggest superinfection although sterility is not ascertained on imaging. Otherwise normal appearance of the right ovary with several normal follicles.  Left ovary  Measurements: 3.7 x 2.2 x 2.9 cm = volume: 12.3 mL. The left ovary is visualized only on transabdominal abdominal imaging. No gross abnormality or adnexal lesion is seen.  Pulsed Doppler evaluation of both ovaries demonstrates normal low-resistance arterial and venous waveforms.  Other findings  No abnormal free fluid.  IMPRESSION: 1. 9.7 x 2.8 x 4.0 cm fluid distended tubular structure in the right adnexa situated medial to the ovary and lateral to the adjacent uterus. Most suggestive of a dilated hydrosalpinx without significant internal complexity, septation or debris, to suggest superinfection although sterility is not ascertained on imaging. 2. No evidence of ovarian torsion. 3. Malpositioned IUD protruding into the right fundal myometrium. Mild heterogeneity of the uterine myometrium is nonspecific.  Electronically Signed   By: Lovena Le M.D.   On: 08/20/2020 23:54 CT ABDOMEN PELVIS W CONTRAST CLINICAL DATA:  Right sided flank pain  EXAM: CT ABDOMEN AND PELVIS WITH CONTRAST  TECHNIQUE: Multidetector CT imaging of the abdomen and pelvis was performed using the standard protocol following bolus administration of intravenous contrast.  CONTRAST:  73mL OMNIPAQUE IOHEXOL 300 MG/ML  SOLN  COMPARISON:  Ultrasound 08/20/2020, CT 11/27/2019  FINDINGS: Lower chest: Lung bases demonstrate no acute consolidation or effusion.  Hepatobiliary: Subcentimeter hypodensity within the right hepatic lobe too small to further characterize. Gallstone. No biliary dilatation  Pancreas: Unremarkable. No pancreatic ductal dilatation or surrounding inflammatory changes.  Spleen: Normal in size without focal abnormality.  Adrenals/Urinary Tract:  Adrenal glands are normal. Subcentimeter hypodensities within the kidneys, too small to further characterize though probably cysts. The bladder is unremarkable. No hydronephrosis. Slightly prominent right ureter with mild urothelial enhancement. No obstructing stone.  Stomach/Bowel: Stomach is within normal limits. Appendix appears normal. No evidence of bowel wall thickening, distention, or inflammatory changes.  Vascular/Lymphatic: No significant vascular findings are present. No enlarged abdominal or pelvic lymph nodes.  Reproductive: Abnormal positioning of intrauterine device with T arms visible at the right fundal myometrium. Interim development of oblong fluid structure in the right adnexa measuring 6.6 cm by 3.2 cm. 2.7 cm left labial cyst.  Other: Negative for free air or free fluid.  Musculoskeletal: No acute or significant osseous findings.  IMPRESSION: 1. Negative for hydronephrosis or ureteral stone. Slightly prominent right ureter with mild urothelial enhancement, question ascending urinary tract infection. Suggest correlation with urinalysis. 2. Interim development of oblong fluid structure in the right adnexa measuring up to 6.6 cm, appearance suggests hydrosalpinx, less likely large adnexal cyst, correlation with pelvic ultrasound may be considered. 3. Abnormal positioning of intrauterine device with T arms visible within the right fundal myometrium 4. Gallstone.  Electronically Signed   By: Donavan Foil M.D.   On: 08/20/2020 21:27 US ABDOMEN LIMITED RUQ (LIVER/GB) CLINICAL DATA:  Right upper quadrant pain for 4 days  EXAM: ULTRASOUND ABDOMEN LIMITED RIGHT UPPER QUADRANT  COMPARISON:  None.  FINDINGS: Gallbladder:  Multiple shadowing gallstones  are identified measuring up to 9 mm in size. No evidence of gallbladder wall thickening or pericholecystic fluid. Negative sonographic Murphy sign.  Common bile duct:  Diameter: 6 mm  Liver:  No focal  lesion identified. Within normal limits in parenchymal echogenicity. Portal vein is patent on color Doppler imaging with normal direction of blood flow towards the liver.  Other: None.  IMPRESSION: 1. Cholelithiasis without acute cholecystitis.  Electronically Signed   By: Randa Ngo M.D.   On: 08/20/2020 20:44   Assessment:   1. Hydrosalpinx   2. Encounter for IUD removal     Plan:   1. Hydrosalpinx recently treated with Doxycycline.  Needs to be scheduled for a f/u ultrasound to assess for resolution.  Will schedule.  2. IUD removed today at patient's request.  Plans to start birth control pills. Notes she already received a prescription for the pills by her PCP. 3. Patient with elevated blood pressure today, no prior h/o HTN. Would need to consider a progesterone-only pill if HTN does arise.    IUD Removal  Patient identified, informed consent performed, consent signed.  Patient was in the dorsal lithotomy position, normal external genitalia was noted.  A speculum was placed in the patient's vagina, normal discharge was noted, no lesions. The cervix was visualized, no lesions, no abnormal discharge.  The strings of the IUD were grasped and pulled using ring forceps. The IUD was removed in its entirety. Patient tolerated the procedure well.Routine preventative health maintenance measures emphasized.   Rubie Maid, MD [facility]

## 2020-09-23 NOTE — Progress Notes (Signed)
Pt present for f/u after hospital admission for hydrosalpinx.

## 2020-09-23 NOTE — Patient Instructions (Signed)
Health Maintenance, Female Adopting a healthy lifestyle and getting preventive care are important in promoting health and wellness. Ask your health care provider about:  The right schedule for you to have regular tests and exams.  Things you can do on your own to prevent diseases and keep yourself healthy. What should I know about diet, weight, and exercise? Eat a healthy diet  Eat a diet that includes plenty of vegetables, fruits, low-fat dairy products, and lean protein.  Do not eat a lot of foods that are high in solid fats, added sugars, or sodium.   Maintain a healthy weight Body mass index (BMI) is used to identify weight problems. It estimates body fat based on height and weight. Your health care provider can help determine your BMI and help you achieve or maintain a healthy weight. Get regular exercise Get regular exercise. This is one of the most important things you can do for your health. Most adults should:  Exercise for at least 150 minutes each week. The exercise should increase your heart rate and make you sweat (moderate-intensity exercise).  Do strengthening exercises at least twice a week. This is in addition to the moderate-intensity exercise.  Spend less time sitting. Even light physical activity can be beneficial. Watch cholesterol and blood lipids Have your blood tested for lipids and cholesterol at 42 years of age, then have this test every 5 years. Have your cholesterol levels checked more often if:  Your lipid or cholesterol levels are high.  You are older than 42 years of age.  You are at high risk for heart disease. What should I know about cancer screening? Depending on your health history and family history, you may need to have cancer screening at various ages. This may include screening for:  Breast cancer.  Cervical cancer.  Colorectal cancer.  Skin cancer.  Lung cancer. What should I know about heart disease, diabetes, and high blood  pressure? Blood pressure and heart disease  High blood pressure causes heart disease and increases the risk of stroke. This is more likely to develop in people who have high blood pressure readings, are of African descent, or are overweight.  Have your blood pressure checked: ? Every 3-5 years if you are 18-39 years of age. ? Every year if you are 40 years old or older. Diabetes Have regular diabetes screenings. This checks your fasting blood sugar level. Have the screening done:  Once every three years after age 40 if you are at a normal weight and have a low risk for diabetes.  More often and at a younger age if you are overweight or have a high risk for diabetes. What should I know about preventing infection? Hepatitis B If you have a higher risk for hepatitis B, you should be screened for this virus. Talk with your health care provider to find out if you are at risk for hepatitis B infection. Hepatitis C Testing is recommended for:  Everyone born from 1945 through 1965.  Anyone with known risk factors for hepatitis C. Sexually transmitted infections (STIs)  Get screened for STIs, including gonorrhea and chlamydia, if: ? You are sexually active and are younger than 42 years of age. ? You are older than 42 years of age and your health care provider tells you that you are at risk for this type of infection. ? Your sexual activity has changed since you were last screened, and you are at increased risk for chlamydia or gonorrhea. Ask your health care provider   if you are at risk.  Ask your health care provider about whether you are at high risk for HIV. Your health care provider may recommend a prescription medicine to help prevent HIV infection. If you choose to take medicine to prevent HIV, you should first get tested for HIV. You should then be tested every 3 months for as long as you are taking the medicine. Pregnancy  If you are about to stop having your period (premenopausal) and  you may become pregnant, seek counseling before you get pregnant.  Take 400 to 800 micrograms (mcg) of folic acid every day if you become pregnant.  Ask for birth control (contraception) if you want to prevent pregnancy. Osteoporosis and menopause Osteoporosis is a disease in which the bones lose minerals and strength with aging. This can result in bone fractures. If you are 65 years old or older, or if you are at risk for osteoporosis and fractures, ask your health care provider if you should:  Be screened for bone loss.  Take a calcium or vitamin D supplement to lower your risk of fractures.  Be given hormone replacement therapy (HRT) to treat symptoms of menopause. Follow these instructions at home: Lifestyle  Do not use any products that contain nicotine or tobacco, such as cigarettes, e-cigarettes, and chewing tobacco. If you need help quitting, ask your health care provider.  Do not use street drugs.  Do not share needles.  Ask your health care provider for help if you need support or information about quitting drugs. Alcohol use  Do not drink alcohol if: ? Your health care provider tells you not to drink. ? You are pregnant, may be pregnant, or are planning to become pregnant.  If you drink alcohol: ? Limit how much you use to 0-1 drink a day. ? Limit intake if you are breastfeeding.  Be aware of how much alcohol is in your drink. In the [place] one drink equals one 12 oz bottle of beer (355 mL), one 5 oz glass of wine (148 mL), or one 1 oz glass of hard liquor (44 mL). General instructions  Schedule regular health, dental, and eye exams.  Stay current with your vaccines.  Tell your health care provider if: ? You often feel depressed. ? You have ever been abused or do not feel safe at home. Summary  Adopting a healthy lifestyle and getting preventive care are important in promoting health and wellness.  Follow your health care provider's instructions about healthy  diet, exercising, and getting tested or screened for diseases.  Follow your health care provider's instructions on monitoring your cholesterol and blood pressure. This information is not intended to replace advice given to you by your health care provider. Make sure you discuss any questions you have with your health care provider. Document Revised: 05/03/2018 Document Reviewed: 05/03/2018 Elsevier Patient Education  2021 [employer]  

## 2020-09-26 ENCOUNTER — Encounter: Payer: Self-pay | Admitting: Obstetrics and Gynecology

## 2020-10-15 ENCOUNTER — Ambulatory Visit: Payer: Self-pay | Attending: Oncology

## 2020-10-15 ENCOUNTER — Other Ambulatory Visit: Payer: Self-pay

## 2020-10-15 ENCOUNTER — Ambulatory Visit
Admission: RE | Admit: 2020-10-15 | Discharge: 2020-10-15 | Disposition: A | Discharge status: Home / Self Care | Payer: Self-pay | Source: Ambulatory Visit | Attending: Oncology | Admitting: Oncology

## 2020-10-15 VITALS — BP 138/110 | HR 116 | Temp 99.2°F | Ht 61.0 in | Wt 142.2 lb

## 2020-10-15 DIAGNOSIS — Z0000 Encounter for general adult medical examination without abnormal findings: Secondary | ICD-10-CM

## 2020-10-15 NOTE — Progress Notes (Signed)
  Subjective:     Patient ID: Brittany Richards, female   DOB: 1980/06/30, 40 y.o.   MRN: [id number]  HPI   Review of Systems     Objective:   Physical Exam Chest:  Breasts:     Right: No swelling, bleeding, inverted nipple, mass, nipple discharge, skin change or tenderness.     Left: No swelling, bleeding, inverted nipple, mass, nipple discharge, skin change or tenderness.          Assessment:     40 year old Hispanic patient presents for [place] clinic visit.  Patient screened, and meets [facility] eligibility.  Patient does not have insurance, Medicare or Medicaid. Instructed patient on breast self awareness using teach back method.  Clinical breast exam unremarkable.  No mass or lump palpated.  Jaqui Laukaitis interpreted exam.   Risk Assessment    Risk Scores      10/15/2020   Last edited by: Rico Junker, RN   5-year risk: 0.4 %   Lifetime risk: 6.3 %            Plan:     Sent for bilateral screening mammogram.

## 2020-10-20 NOTE — Progress Notes (Signed)
Letter mailed from [facility] to notify of normal mammogram results.  Patient to return in one year for annual screening.  Copy to [facility]. 

## 2020-10-23 ENCOUNTER — Ambulatory Visit
Admission: RE | Admit: 2020-10-23 | Discharge: 2020-10-23 | Disposition: A | Discharge status: Home / Self Care | Payer: Self-pay | Source: Ambulatory Visit | Attending: Obstetrics and Gynecology | Admitting: Obstetrics and Gynecology

## 2020-10-23 ENCOUNTER — Other Ambulatory Visit: Payer: Self-pay

## 2020-10-23 DIAGNOSIS — N7011 Chronic salpingitis: Secondary | ICD-10-CM | POA: Insufficient documentation

## 2021-01-30 ENCOUNTER — Emergency Department: Payer: Self-pay

## 2021-01-30 ENCOUNTER — Other Ambulatory Visit: Payer: Self-pay

## 2021-01-30 ENCOUNTER — Observation Stay
Admission: EM | Admit: 2021-01-30 | Discharge: 2021-01-31 | Disposition: A | Discharge status: Home / Self Care | Payer: Self-pay | Attending: Obstetrics and Gynecology | Admitting: Obstetrics and Gynecology

## 2021-01-30 DIAGNOSIS — R102 Pelvic and perineal pain: Secondary | ICD-10-CM

## 2021-01-30 DIAGNOSIS — Z20822 Contact with and (suspected) exposure to covid-19: Secondary | ICD-10-CM | POA: Insufficient documentation

## 2021-01-30 DIAGNOSIS — A419 Sepsis, unspecified organism: Secondary | ICD-10-CM | POA: Insufficient documentation

## 2021-01-30 DIAGNOSIS — N7011 Chronic salpingitis: Principal | ICD-10-CM | POA: Insufficient documentation

## 2021-01-30 DIAGNOSIS — R000 Tachycardia, unspecified: Secondary | ICD-10-CM | POA: Insufficient documentation

## 2021-01-30 DIAGNOSIS — N7093 Salpingitis and oophoritis, unspecified: Secondary | ICD-10-CM | POA: Diagnosis present

## 2021-01-30 LAB — COMPREHENSIVE METABOLIC PANEL
ALT: 18 U/L (ref 0–44)
AST: 16 U/L (ref 15–41)
Albumin: 4.1 g/dL (ref 3.5–5.0)
Alkaline Phosphatase: 62 U/L (ref 38–126)
Anion gap: 10 (ref 5–15)
BUN: 7 mg/dL (ref 6–20)
CO2: 25 mmol/L (ref 22–32)
Calcium: 9.3 mg/dL (ref 8.9–10.3)
Chloride: 98 mmol/L (ref 98–111)
Creatinine, Ser: 0.56 mg/dL (ref 0.44–1.00)
GFR, Estimated: >60 mL/min (ref >60)
Glucose, Bld: 120 mg/dL — ABNORMAL HIGH (ref 70–99)
Potassium: 3.8 mmol/L (ref 3.5–5.1)
Sodium: 133 mmol/L — ABNORMAL LOW (ref 135–145)
Total Bilirubin: 0.8 mg/dL (ref 0.3–1.2)
Total Protein: 7.9 g/dL (ref 6.5–8.1)

## 2021-01-30 LAB — URINALYSIS, COMPLETE (UACMP) WITH MICROSCOPIC
Bilirubin Urine: NEGATIVE
Glucose, UA: NEGATIVE mg/dL
Ketones, ur: NEGATIVE mg/dL
Leukocytes,Ua: NEGATIVE
Nitrite: NEGATIVE
Protein, ur: NEGATIVE mg/dL
Specific Gravity, Urine: 1.02 (ref 1.005–1.030)
pH: 6 (ref 5.0–8.0)

## 2021-01-30 LAB — PROTIME-INR
INR: 1 (ref 0.8–1.2)
Prothrombin Time: 13.6 seconds (ref 11.4–15.2)

## 2021-01-30 LAB — PREGNANCY, URINE: Preg Test, Ur: NEGATIVE

## 2021-01-30 LAB — WET PREP, GENITAL
Clue Cells Wet Prep HPF POC: NONE SEEN
Sperm: NONE SEEN
Trich, Wet Prep: NONE SEEN
Yeast Wet Prep HPF POC: NONE SEEN

## 2021-01-30 LAB — CBC
HCT: 34.5 % — ABNORMAL LOW (ref 36.0–46.0)
Hemoglobin: 12.2 g/dL (ref 12.0–15.0)
MCH: 32.1 pg (ref 26.0–34.0)
MCHC: 35.4 g/dL (ref 30.0–36.0)
MCV: 90.8 fL (ref 80.0–100.0)
Platelets: 348 10*3/uL (ref 150–400)
RBC: 3.8 MIL/uL — ABNORMAL LOW (ref 3.87–5.11)
RDW: 12.3 % (ref 11.5–15.5)
WBC: 17.5 10*3/uL — ABNORMAL HIGH (ref 4.0–10.5)
nRBC: 0 % (ref 0.0–0.2)

## 2021-01-30 LAB — LACTIC ACID, PLASMA
Lactic Acid, Venous: 1.1 mmol/L (ref 0.5–1.9)
Lactic Acid, Venous: 1.4 mmol/L (ref 0.5–1.9)

## 2021-01-30 LAB — APTT: aPTT: 29 seconds (ref 24–36)

## 2021-01-30 LAB — LIPASE, BLOOD: Lipase: 25 U/L (ref 11–51)

## 2021-01-30 LAB — CHLAMYDIA/NGC RT PCR (ARMC ONLY)
Chlamydia Tr: NOT DETECTED
N gonorrhoeae: NOT DETECTED

## 2021-01-30 LAB — RESP PANEL BY RT-PCR (FLU A&B, COVID) ARPGX2
Influenza A by PCR: NEGATIVE
Influenza B by PCR: NEGATIVE
SARS Coronavirus 2 by RT PCR: NEGATIVE

## 2021-01-30 LAB — POC URINE PREG, ED: Preg Test, Ur: NEGATIVE

## 2021-01-30 MED ORDER — LACTATED RINGERS IV BOLUS: 1000.0000 mL | Freq: Once | INTRAVENOUS | Status: DC

## 2021-01-30 MED ORDER — ACETAMINOPHEN 325 MG PO TABS
650.0000 mg | ORAL_TABLET | ORAL | Status: DC | PRN
  Administered 2021-01-31: 650 mg via ORAL
  Filled 2021-01-30: qty 2

## 2021-01-30 MED ORDER — DEXTROSE IN LACTATED RINGERS 5 % IV SOLN: INTRAVENOUS | Status: DC

## 2021-01-30 MED ORDER — SODIUM CHLORIDE 0.9 % IV SOLN
100.0000 mg | Freq: Once | INTRAVENOUS | Status: AC
  Administered 2021-01-30: 100 mg via INTRAVENOUS
  Filled 2021-01-30: qty 100

## 2021-01-30 MED ORDER — PRENATAL MULTIVITAMIN CH: 1.0000 | ORAL_TABLET | Freq: Every day | ORAL | Status: DC

## 2021-01-30 MED ORDER — TRAMADOL HCL 50 MG PO TABS
50.0000 mg | ORAL_TABLET | Freq: Four times a day (QID) | ORAL | Status: DC | PRN
  Administered 2021-01-30 – 2021-01-31 (×2): 50 mg via ORAL
  Filled 2021-01-30 (×2): qty 1

## 2021-01-30 MED ORDER — OXYCODONE-ACETAMINOPHEN 5-325 MG PO TABS
1.0000 | ORAL_TABLET | Freq: Once | ORAL | Status: AC
  Administered 2021-01-30: 1 via ORAL
  Filled 2021-01-30: qty 1

## 2021-01-30 MED ORDER — METRONIDAZOLE 500 MG/100ML IV SOLN
500.0000 mg | Freq: Once | INTRAVENOUS | Status: AC
  Administered 2021-01-30: 500 mg via INTRAVENOUS
  Filled 2021-01-30: qty 100

## 2021-01-30 MED ORDER — MORPHINE SULFATE (PF) 4 MG/ML IV SOLN
6.0000 mg | Freq: Once | INTRAVENOUS | Status: AC
  Administered 2021-01-30: 6 mg via INTRAVENOUS
  Filled 2021-01-30: qty 2

## 2021-01-30 MED ORDER — LACTATED RINGERS IV BOLUS
30.0000 mL/kg | Freq: Once | INTRAVENOUS | Status: AC
  Administered 2021-01-30: 1000 mL via INTRAVENOUS

## 2021-01-30 MED ORDER — SODIUM CHLORIDE 0.9 % IV SOLN
1.0000 g | Freq: Once | INTRAVENOUS | Status: AC
  Administered 2021-01-30: 1 g via INTRAVENOUS
  Filled 2021-01-30: qty 10

## 2021-01-30 MED ORDER — LACTATED RINGERS IV SOLN: INTRAVENOUS | Status: DC

## 2021-01-30 MED ORDER — IOHEXOL 350 MG/ML SOLN
100.0000 mL | Freq: Once | INTRAVENOUS | Status: AC | PRN
  Administered 2021-01-30: 100 mL via INTRAVENOUS
  Filled 2021-01-30: qty 100

## 2021-01-30 MED ORDER — METRONIDAZOLE 500 MG PO TABS
500.0000 mg | ORAL_TABLET | Freq: Two times a day (BID) | ORAL | Status: DC
  Administered 2021-01-31: 500 mg via ORAL
  Filled 2021-01-30 (×3): qty 1

## 2021-01-30 NOTE — ED Notes (Signed)
Dr Creig Hines messaged per pt request for pain medication.

## 2021-01-30 NOTE — Progress Notes (Signed)
CODE SEPSIS - PHARMACY COMMUNICATION  **Broad Spectrum Antibiotics should be administered within 1 hour of Sepsis diagnosis**  Time Code Sepsis Called/Page Received: 1645  Antibiotics Ordered: ceftriaxone 1 gram + metronidazole 500 mg   Time of 1st antibiotic administration: 1344 CTX, 1734 metronidazole  Additional action taken by pharmacy: None  If necessary, Name of Provider/Nurse Contacted: [place], PharmD Pharmacy Resident  01/30/2021 6:12 PM

## 2021-01-30 NOTE — ED Notes (Signed)
Attempted to call third floor to give report, no answer at nursing desk at this time.

## 2021-01-30 NOTE — Sepsis Progress Note (Signed)
Code Sepsis discontinued @ 2020 PM

## 2021-01-30 NOTE — ED Notes (Signed)
Patient transported to CT 

## 2021-01-30 NOTE — ED Notes (Signed)
Chaperoned ed provider for pelvic exam

## 2021-01-30 NOTE — Sepsis Progress Note (Signed)
eLink is monitoring this Code Sepsis. °

## 2021-01-30 NOTE — ED Triage Notes (Signed)
Pt comes with c/o RLQ pain that started Sunday. Pt states it has gotten worse today. Pt denies any N.V.D

## 2021-01-30 NOTE — ED Provider Notes (Signed)
[facility] Emergency Department Provider Note  ____________________________________________   Event Date/Time   First MD Initiated Contact with Patient 01/30/21 1025     (approximate)  I have reviewed the triage vital signs and the nursing notes.   HISTORY  Chief Complaint RLQ pain   HPI Brittany Richards is a 40 y.o. female with past history of anemia, cholelithiasis, PAD and hydrosalpinx having been admitted earlier this year 3/33/31 for this and subsequently having a Mirena IUD placed which was removed on 5/3 as she reported developed infection with this who presents for assessment of their 4 days of some right lower quadrant abdominal pain.  She endorses some intermittent diarrhea over this time.  She denies any headache, earache, sore throat, nausea, vomiting, diarrhea, dysuria, chest pain, cough, burning with urination, abnormal vaginal bleeding or discharge, rash or other acute complaints.  No other acute concerns at this time.         Past Medical History:  Diagnosis Date   Anemia    Hydrosalpinx     Patient Active Problem List   Diagnosis Date Noted   Hydrosalpinx 08/21/2020   Cholelithiases 08/21/2020   PID (acute pelvic inflammatory disease) 08/21/2020   IUD (intrauterine device) in place 08/21/2020   Hematoma of abdominal wall 11/27/2019    Past Surgical History:  Procedure Laterality Date   EXCISION OF ABDOMINAL WALL TUMOR N/A 11/13/2019   Procedure: EXCISION OF ABDOMINAL WALL TUMOR;  Surgeon: Jules Husbands, MD;  Location: [facility] ORS;  Service: General;  Laterality: N/A;    Prior to Admission medications   Medication Sig Start Date End Date Taking? Authorizing Provider  levonorgestrel (MIRENA) 20 MCG/24HR IUD 1 each by Intrauterine route once.    [provider]    Allergies Patient has no known allergies.  Family History  Problem Relation Age of Onset   Heart attack Mother    Hypertension Mother    Healthy  Father     Social History Social History   Tobacco Use   Smoking status: Never   Smokeless tobacco: Never  Vaping Use   Vaping Use: Never used  Substance Use Topics   Alcohol use: No   Drug use: No    Review of Systems  Review of Systems  Constitutional:  Negative for chills and fever.  HENT:  Negative for sore throat.   Eyes:  Negative for pain.  Respiratory:  Negative for cough and stridor.   Cardiovascular:  Negative for chest pain.  Gastrointestinal:  Positive for abdominal pain and diarrhea. Negative for vomiting.  Genitourinary:  Negative for dysuria.  Musculoskeletal:  Negative for myalgias.  Skin:  Negative for rash.  Neurological:  Negative for seizures, loss of consciousness and headaches.  Psychiatric/Behavioral:  Negative for suicidal ideas.   All other systems reviewed and are negative.    ____________________________________________   PHYSICAL EXAM:  VITAL SIGNS: ED Triage Vitals  Enc Vitals Group     BP 01/30/21 0938 (!) 147/96     Pulse Rate 01/30/21 0938 (!) 112     Resp 01/30/21 0938 18     Temp 01/30/21 0938 98.6 F (37 C)     Temp src --      SpO2 01/30/21 0938 97 %     Weight --      Height --      Head Circumference --      Peak Flow --      Pain Score 01/30/21 0935 8  Pain Loc --      Pain Edu? --      Excl. in [place]? --    Vitals:   01/30/21 1430 01/30/21 1530  BP: (!) 134/93 136/84  Pulse: (!) 103 (!) 108  Resp: 18 18  Temp:    SpO2: 97% 99%   Physical Exam Vitals and nursing note reviewed.  Constitutional:      General: She is not in acute distress.    Appearance: She is well-developed.  HENT:     Head: Normocephalic and atraumatic.     Right Ear: External ear normal.     Left Ear: External ear normal.     Nose: Nose normal.     Mouth/Throat:     Mouth: Mucous membranes are dry.  Eyes:     Conjunctiva/sclera: Conjunctivae normal.  Cardiovascular:     Rate and Rhythm: Regular rhythm. Tachycardia present.      Heart sounds: No murmur heard. Pulmonary:     Effort: Pulmonary effort is normal. No respiratory distress.     Breath sounds: Normal breath sounds.  Abdominal:     Palpations: Abdomen is soft.     Tenderness: There is abdominal tenderness in the right lower quadrant. There is right CVA tenderness. There is no left CVA tenderness.  Musculoskeletal:     Cervical back: Neck supple.  Skin:    General: Skin is warm and dry.     Capillary Refill: Capillary refill takes 2 to 3 seconds.  Neurological:     Mental Status: She is alert and oriented to person, place, and time.  Psychiatric:        Mood and Affect: Mood normal.    GU exam remarkable for some purulent drainage from closed os.  There is no significant friability erythema or bleeding on exam with os.  Vaginal vault is otherwise unremarkable.  No significant adnexal tenderness. ____________________________________________   LABS (all labs ordered are listed, but only abnormal results are displayed)  Labs Reviewed  WET PREP, GENITAL - Abnormal; Notable for the following components:      Result Value   WBC, Wet Prep HPF POC MANY (*)    All other components within normal limits  COMPREHENSIVE METABOLIC PANEL - Abnormal; Notable for the following components:   Sodium 133 (*)    Glucose, Bld 120 (*)    All other components within normal limits  CBC - Abnormal; Notable for the following components:   WBC 17.5 (*)    RBC 3.80 (*)    HCT 34.5 (*)    All other components within normal limits  URINALYSIS, COMPLETE (UACMP) WITH MICROSCOPIC - Abnormal; Notable for the following components:   Hgb urine dipstick TRACE (*)    Bacteria, UA RARE (*)    All other components within normal limits  RESP PANEL BY RT-PCR (FLU A&B, COVID) ARPGX2  CHLAMYDIA/NGC RT PCR ([facility] ONLY)            CULTURE, BLOOD (SINGLE)  URINE CULTURE  LIPASE, BLOOD  PREGNANCY, URINE  LACTIC ACID, PLASMA  LACTIC ACID, PLASMA  PROTIME-INR  APTT  POC URINE PREG, ED    ____________________________________________  EKG  ____________________________________________  RADIOLOGY  ED MD interpretation: CT abdomen pelvis remarkable for two-point centimeter right sided adnexal cyst as well as right-sided hydrosalpinx more distended compared to CT obtained in March 30.  There is also minimal fluid in the pelvic cul-de-sac.  There is no evidence of appendicitis, diverticulitis, kidney stone, perinephric stranding, cholecystitis or other  acute abdominal pelvic process.  Pelvic ultrasound shows 2.6 m simple right ovarian cyst with some persistent right hydrosalpinx.  No evidence of torsion.  There is fibroids in uterus.  No other acute process or abscess.   Official radiology report(s): CT ABDOMEN PELVIS W CONTRAST  Result Date: 01/30/2021 CLINICAL DATA:  Acute right lower quadrant abdominal pain, worse today. EXAM: CT ABDOMEN AND PELVIS WITH CONTRAST TECHNIQUE: Multidetector CT imaging of the abdomen and pelvis was performed using the standard protocol following bolus administration of intravenous contrast. CONTRAST:  186m OMNIPAQUE IOHEXOL 350 MG/ML SOLN COMPARISON:  08/20/2020; 11/27/2019; 10/13/2019; pelvic ultrasound-10/23/2020; 08/20/2020 FINDINGS: Lower chest: Limited visualization of the lower thorax is degraded secondary to patient respiratory artifact. There is minimal subsegmental atelectasis involving the inferior aspect of the lingula. No discrete focal airspace opacities. Hepatobiliary: Normal hepatic contour there is a punctate subcentimeter hypoattenuating lesion within the right lobe of the liver (image 12, series 2), which is too small to accurately characterize though favored to represent a hepatic cyst. There is a minimal amount of focal fatty infiltration adjacent to the fissure for the ligamentum teres. Note is made of an approximately 0.5 cm stone within the neck of an otherwise normal-appearing gallbladder. No gallbladder wall thickening or  pericholecystic stranding. No intra or extrahepatic biliary duct dilatation. No ascites. Pancreas: Normal appearance of the pancreas. Spleen: Normal appearance of the spleen. Note is made of a small splenule about the anterior tip of the spleen. Adrenals/Urinary Tract: There is symmetric enhancement and excretion of the bilateral kidneys. No evidence of nephrolithiasis on this postcontrast examination. Subcentimeter bilateral hypoattenuating renal lesions are too small to adequately characterize though similar to the 09/2019 examination and favored to represent renal cysts. No urinary obstruction or perinephric stranding. Normal appearance of the bilateral adrenal glands. Normal appearance of the urinary bladder given underdistention. Stomach/Bowel: Moderate colonic stool burden without evidence of enteric obstruction. Normal appearance of the terminal ileum and appendix. No discrete areas of bowel wall thickening. No hiatal hernia. No pneumoperitoneum, pneumatosis or portal venous gas. Vascular/Lymphatic: Normal caliber the abdominal aorta. The major branch vessels of the abdominal aorta appear patent on this non CTA examination. No bulky retroperitoneal, mesenteric, pelvic or inguinal lymphadenopathy. Reproductive: Note is made of an approximately 2.7 x 2.5 x 2.2 cm right-sided presumably physiologic adnexal cyst (axial image 61, series 2; coronal image 45, series 5). This finding is associated with worsening fluid distension of right-sided hydrosalpinx with the right-sided fallopian tube measuring approximately 3.2 cm in diameter (axial image 57, series 2). There is a minimal amount of fluid within the pelvic cul-de-sac without definitive adnexal stranding. No discrete left-sided adnexal lesions. Interval removal of intrauterine device. There is a trace amount of free fluid in the pelvic cul-de-sac. Other: Tiny mesenteric fat containing periumbilical hernia. Post resection left external oblique nodule without  evidence of recurrence. Musculoskeletal: No acute or aggressive osseous abnormalities. IMPRESSION: 1. Note is made of an approximately 2.7 cm right-sided presumably physiologic adnexal cyst with findings of right-sided hydrosalpinx. The right-sided hydrosalpinx appears more fluid distending compared to abdominal CT performed 08/20/2020. There is a minimal amount of presumably physiologic fluid in the pelvic cul-de-sac without definitive peri-adnexal stranding. 2. Otherwise, no explanation for patient's right lower quadrant abdominal pain. Specifically, no evidence of enteric or urinary obstruction. Normal appearance of the appendix. 3. Interval removal of intrauterine device. 4. Cholelithiasis without evidence of cholecystitis. 5. Post resection left external oblique nodule without evidence of recurrence. Electronically Signed   By: JJenny Reichmann  Watts M.D.   On: 01/30/2021 12:57   US PELVIC COMPLETE W TRANSVAGINAL AND TORSION R/O  Result Date: 01/30/2021 CLINICAL DATA:  Right pelvic pain EXAM: TRANSABDOMINAL AND TRANSVAGINAL ULTRASOUND OF PELVIS DOPPLER ULTRASOUND OF OVARIES TECHNIQUE: Both transabdominal and transvaginal ultrasound examinations of the pelvis were performed. Transabdominal technique was performed for global imaging of the pelvis including uterus, ovaries, adnexal regions, and pelvic cul-de-sac. It was necessary to proceed with endovaginal exam following the transabdominal exam to visualize the endometrium and ovaries. Color and duplex Doppler ultrasound was utilized to evaluate blood flow to the ovaries. COMPARISON:  Pelvic ultrasound 10/23/2020, same-day CT abdomen/pelvis FINDINGS: Uterus Measurements: 11.2 cm x 4.8 cm x 6.1 cm = volume: 174 mL. Two uterine fibroids are identified measuring 2.5 cm x 2.3 cm in the posterior uterine wall and 1.3 cm x 1.0 cm in the anterior uterine wall. Endometrium Thickness: 10 mm.  No focal abnormality visualized. Right ovary Measurements: 3.8 cm x 2.8 cm x 2.9 cm =  volume: 15.8 mL. A right ovarian cyst is seen measuring 2.5 cm x 2.1 cm x 2.6 cm. There is persistent right hydrosalpinx within the fallopian tube grossly similar in caliber to the prior ultrasound from 10/23/2020. Left ovary Measurements: 3.1 cm x 1.5 cm x 2.1 cm = volume: 5.2 mL. Normal appearance/no adnexal mass. Pulsed Doppler evaluation of both ovaries demonstrates normal low-resistance arterial and venous waveforms. Other findings No abnormal free fluid. IMPRESSION: 1. 2.6 cm simple right ovarian cyst with persistent right hydrosalpinx; the fallopian tube grossly similar in caliber to the prior ultrasound. 2. No evidence of torsion. 3. Fibroid uterus. Electronically Signed   By: Valetta Mole M.D.   On: 01/30/2021 16:49    ____________________________________________   PROCEDURES  Procedure(s) performed (including Critical Care):  .Critical Care Performed by: Lucrezia Starch, MD Authorized by: Lucrezia Starch, MD   Critical care provider statement:    Critical care time (minutes):  45   Critical care was necessary to treat or prevent imminent or life-threatening deterioration of the following conditions:  Sepsis   Critical care was time spent personally by me on the following activities:  Discussions with consultants, evaluation of patient's response to treatment, examination of patient, ordering and performing treatments and interventions, ordering and review of laboratory studies, ordering and review of radiographic studies, pulse oximetry, re-evaluation of patient's condition, obtaining history from patient or surrogate and review of old charts   ____________________________________________   INITIAL IMPRESSION / [place] / ED COURSE      Patient presents with above to history exam for assessment of 3 to 4 days of worsening right lower quadrant [place] pain associate with some diarrhea.  On arrival she is tachycardic with otherwise stable vital signs on room air.  On  exam she does have some tenderness in the right lower quadrant as well as purulent drainage noted from cervical os.  Differential includes hydrosalpinx, TOA, PID, appendicitis, diverticulitis, cystitis possible diverticulitis.  CT abdomen pelvis remarkable for two-point centimeter right sided adnexal cyst as well as right-sided hydrosalpinx more distended compared to CT obtained in March 30.  There is also minimal fluid in the pelvic cul-de-sac.  There is no evidence of appendicitis, diverticulitis, kidney stone, perinephric stranding, cholecystitis or other acute abdominal pelvic process.  Pelvic ultrasound remarkable for Pelvic ultrasound shows 2.6 m simple right ovarian cyst with some persistent right hydrosalpinx.  No evidence of torsion.  There is fibroids in uterus.  No other acute process or  abscess.  CMP remarkable for no significant electrolyte or metabolic derangements.  CBC shows a WBC count of 17.5 without acute anemia and normal platelets.  UA is remarkable for trace hemoglobin and rare bacteria.  No other convincing evidence of urinary source for infection.  Urgency test is negative.  Lactic acid is nonelevated at 1.1.  Wet prep remarkable for many WBCs but no trichomoniasis or clue cells.  GC studies are negative.  Given multiple SIRS criteria met i.e. tachycardia leukocytosis with concerns for infectious source right hydrosalpinx and concern for sepsis.  Patient was given broad-spectrum IV antibiotics and blood cultures were obtained as well as 30 cc/kg fluid bolus.  I discussed with on-call OB/GYN physician Dr. Amalia Hailey who will admit to the service for further evaluation management.    ____________________________________________   FINAL CLINICAL IMPRESSION(S) / ED DIAGNOSES  Final diagnoses:  Pelvic pain  Hydrosalpinx  Sepsis, due to unspecified organism, unspecified whether acute organ dysfunction present ([place])    Medications  doxycycline (VIBRAMYCIN) 100 mg in sodium  chloride 0.9 % 250 mL IVPB (100 mg Intravenous New Bag/Given 01/30/21 1457)  metroNIDAZOLE (FLAGYL) IVPB 500 mg (has no administration in time range)  lactated ringers infusion (has no administration in time range)  morphine 4 MG/ML injection 6 mg (6 mg Intravenous Given 01/30/21 1058)  lactated ringers bolus 2,100 mL (1,000 mLs Intravenous New Bag/Given 01/30/21 1054)  iohexol (OMNIPAQUE) 350 MG/ML injection 100 mL (100 mLs Intravenous Contrast Given 01/30/21 1219)  cefTRIAXone (ROCEPHIN) 1 g in sodium chloride 0.9 % 100 mL IVPB (0 g Intravenous Stopped 01/30/21 1414)     ED Discharge Orders     None        Note:  This document was prepared using Dragon voice recognition software and may include unintentional dictation errors.    Lucrezia Starch, MD 01/30/21 [phone]

## 2021-01-31 DIAGNOSIS — N7011 Chronic salpingitis: Principal | ICD-10-CM

## 2021-01-31 MED ORDER — METRONIDAZOLE 500 MG PO TABS: 500.0000 mg | ORAL_TABLET | Freq: Two times a day (BID) | ORAL | 0 refills | Status: AC

## 2021-01-31 MED ORDER — DOXYCYCLINE HYCLATE 100 MG PO CAPS: 100.0000 mg | ORAL_CAPSULE | Freq: Two times a day (BID) | ORAL | 0 refills | Status: AC

## 2021-01-31 NOTE — Discharge Summary (Addendum)
Discharge from observation note  SUBJECTIVE Brittany Richards is a 40 y.o. [phone] female who presented to triage from the emergency department after being seen for elevated white count persistent right lower quadrant abdominal pain.  Patient was subsequently found to have what appears to be a dilated right fallopian tube likely consistent with a TOA.  She had this problem in May was briefly observed in the hospital and discharged on antibiotics.  She subsequently improved and did well.  Over the last 3 to 4 days she has experienced the same type of pain. Through the night and today her pain was improved. She is taking p.o. without difficulty.  No nausea or vomiting. She has been afebrile throughout her stay. She was seen today with an interpreter present.  OB History  Gravida Para Term Preterm AB Living  4 3 3  0 1 3  SAB IAB Ectopic Multiple Live Births  1 0 0 0 3    # Outcome Date GA Lbr Len/2nd Weight Sex Delivery Anes PTL Lv  4 SAB           3 Term           2 Term     F Vag-Spont     1 Term             Medications Prior to Admission  Medication Sig Dispense Refill Last Dose   ISIBLOOM 0.15-30 MG-MCG tablet Take 1 tablet by mouth daily. (Patient not taking: Reported on 01/30/2021)   Not Taking   levonorgestrel (MIRENA) 20 MCG/24HR IUD 1 each by Intrauterine route once.      omeprazole (PRILOSEC) 20 MG capsule Take 20 mg by mouth daily. (Patient not taking: Reported on 01/30/2021)   Not Taking     OBJECTIVE  Nursing Evaluation:   BP (!) 136/94 (BP Location: Left Arm)   Pulse 85   Temp 98.3 F (36.8 C) (Oral)   Resp 18   LMP 01/19/2021   SpO2 100%    Findings in ED:  Physical Exam Vitals and nursing note reviewed.  Constitutional:      General: She is not in acute distress.    Appearance: She is well-developed.  HENT:     Head: Normocephalic and atraumatic.     Right Ear: External ear normal.     Left Ear: External ear normal.     Nose: Nose normal.      Mouth/Throat:     Mouth: Mucous membranes are dry.  Eyes:     Conjunctiva/sclera: Conjunctivae normal.  Cardiovascular:     Rate and Rhythm: Regular rhythm. Tachycardia present.     Heart sounds: No murmur heard. Pulmonary:     Effort: Pulmonary effort is normal. No respiratory distress.     Breath sounds: Normal breath sounds.  Abdominal:     Palpations: Abdomen is soft.     Tenderness: There is abdominal tenderness in the right lower quadrant. There is right CVA tenderness. There is no left CVA tenderness.  Musculoskeletal:     Cervical back: Neck supple.  Skin:    General: Skin is warm and dry.     Capillary Refill: Capillary refill takes 2 to 3 seconds.  Neurological:     Mental Status: She is alert and oriented to person, place, and time.  Psychiatric:        Mood and Affect: Mood normal.    GU exam remarkable for some purulent drainage from closed os.  There is no significant friability  erythema or bleeding on exam with os.  Vaginal vault is otherwise unremarkable.  No significant adnexal tenderness    My findings today: Ultrasound findings again show right dilated fallopian tube likely consistent with TOA/hydrosalpinx.  This is relatively unchanged from her last ultrasound.     She was placed on antibiotics yesterday and through the night and appears well today.     Her abdomen is soft, nt, non-distended      Although the patient somehow met the sepsis protocol she did not ever appear septic.  Her blood pressures were stable, she was afebrile, taking p.o. without difficulty throughout her stay, ambulatory and in no evidence of acute distress.    ASSESSMENT Impression:  1.  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  Persistent since last brief admission. 2.  She is currently stable and able to take antibiotics as an outpatient.  PLAN 1. Current condition and above findings reviewed with the patient and her husband via interpreter. 2. Discharge home with continuation of oral  antibiotics 3. She already has an appointment scheduled with Dr. Marcelline Mates in 3 weeks and I believe this is likely appropriate follow-up for her hydrosalpinx.  To consider surgical removal in the future.    I spent 52 minutes involved in the care of this patient preparing to see the patient by obtaining and reviewing her medical history (including labs, imaging tests and prior procedures), documenting clinical information in the electronic health record (EHR), counseling and coordinating care plans, writing and sending prescriptions, ordering tests or procedures and in direct communicating with the patient and medical staff discussing pertinent items from her history and physical exam.

## 2021-01-31 NOTE — Progress Notes (Signed)
With the use of an interpreter, patient educated on discharge instructions, medications and follow up appointments. Patient verbalized understanding. Patient will be escorted out by axillary.

## 2021-02-01 LAB — URINE CULTURE: Culture: 70000 — AB

## 2021-02-03 NOTE — H&P (Signed)
ADMIT NOTE  HPI:      Ms. Brittany Richards is a 40 y.o. [phone] who LMP was Patient's last menstrual period was 01/19/2021..  SUBJECTIVE Brittany Richards is a 40 y.o. [phone] female who presented to triage from the emergency department after being seen for elevated white count persistent right lower quadrant abdominal pain.  Patient was subsequently found to have what appears to be a dilated right fallopian tube likely consistent with a TOA.  She had this problem in May was briefly observed in the hospital and discharged on antibiotics.  She subsequently improved and did well.  Over the last 3 to 4 days she has experienced the same type of pain. Through the night and today her pain was improved. She is taking p.o. without difficulty.  No nausea or vomiting. She has been afebrile throughout her stay. She was seen today with an interpreter present.          HISTORY No Known Allergies  OB History  OB History  Gravida Para Term Preterm AB Living  4 3 3  0 1 3  SAB IAB Ectopic Multiple Live Births  1       3    # Outcome Date GA Lbr Len/2nd Weight Sex Delivery Anes PTL Lv  4 SAB           3 Term           2 Term     F Vag-Spont     1 Term             Past Medical History  Past Medical History:  Diagnosis Date   Anemia    Hydrosalpinx     Past Surgical History  Past Surgical History:  Procedure Laterality Date   EXCISION OF ABDOMINAL WALL TUMOR N/A 11/13/2019   Procedure: EXCISION OF ABDOMINAL WALL TUMOR;  Surgeon: Jules Husbands, MD;  Location: [facility] ORS;  Service: General;  Laterality: N/A;      Past Social History:  Social History   Socioeconomic History   Marital status: Single    Spouse name: Not on file   Number of children: Not on file   Years of education: Not on file   Highest education level: Not on file  Occupational History   Not on file  Tobacco Use   Smoking status: Never   Smokeless tobacco: Never  Vaping Use   Vaping Use: Never used   Substance and Sexual Activity   Alcohol use: No   Drug use: No   Sexual activity: Not Currently    Birth control/protection: I.U.D.  Other Topics Concern   Not on file  Social History Narrative   Not on file   Social Determinants of Health   Financial Resource Strain: Not on file  Food Insecurity: Not on file  Transportation Needs: Not on file  Physical Activity: Not on file  Stress: Not on file  Social Connections: Not on file    Family History  Family History  Problem Relation Age of Onset   Heart attack Mother    Hypertension Mother    Healthy Father      ROS: Constitutional: Denied constitutional symptoms, night sweats, recent illness, fatigue, fever, insomnia and weight loss.  Eyes: Denied eye symptoms, eye pain, photophobia, vision change and visual disturbance.  Ears/Nose/Throat/Neck: Denied ear, nose, throat or neck symptoms, hearing loss, nasal discharge, sinus congestion and sore throat.  Cardiovascular: Denied cardiovascular symptoms, arrhythmia, chest pain/pressure, edema, exercise  intolerance, orthopnea and palpitations.  Respiratory: Denied pulmonary symptoms, asthma, pleuritic pain, productive sputum, cough, dyspnea and wheezing.  Gastrointestinal: Denied, gastro-esophageal reflux, melena, nausea and vomiting.  Genitourinary: Denied genitourinary symptoms including symptomatic vaginal discharge, pelvic relaxation issues, and urinary complaints.  Musculoskeletal: Denied musculoskeletal symptoms, stiffness, swelling, muscle weakness and myalgia.  Dermatologic: Denied dermatology symptoms, rash and scar.  Neurologic: Denied neurology symptoms, dizziness, headache, neck pain and syncope.  Psychiatric: Denied psychiatric symptoms, anxiety and depression.  Endocrine: Denied endocrine symptoms including hot flashes and night sweats.   Medications    Discharge Medication List as of 01/31/2021 10:53 AM     START taking these medications   Details  doxycycline  (VIBRAMYCIN) 100 MG capsule Take 1 capsule (100 mg total) by mouth 2 (two) times daily for 21 days., Starting Sat 01/31/2021, Until Sat 02/21/2021, Normal    metroNIDAZOLE (FLAGYL) 500 MG tablet Take 1 tablet (500 mg total) by mouth every 12 (twelve) hours for 21 days., Starting Sat 01/31/2021, Until Sat 02/21/2021, Normal       STOP taking these medications     ISIBLOOM 0.15-30 MG-MCG tablet Comments:  Reason for Stopping:       levonorgestrel (MIRENA) 20 MCG/24HR IUD Comments:  Reason for Stopping:       omeprazole (PRILOSEC) 20 MG capsule Comments:  Reason for Stopping:         @ENCMED @   Objective: Vitals:   01/31/21 0317 01/31/21 0805  BP: 103/71 (!) 136/94  Pulse: 74 85  Resp: 16 18  Temp: 98.3 F (36.8 C) 98.3 F (36.8 C)  SpO2: 96% 100%           Physical Exam Vitals and nursing note reviewed.  Constitutional:      General: She is not in acute distress.    Appearance: She is well-developed.  HENT:     Head: Normocephalic and atraumatic.     Right Ear: External ear normal.     Left Ear: External ear normal.     Nose: Nose normal.     Mouth/Throat:     Mouth: Mucous membranes are dry.  Eyes:     Conjunctiva/sclera: Conjunctivae normal.  Cardiovascular:     Rate and Rhythm: Regular rhythm. Tachycardia present.     Heart sounds: No murmur heard. Pulmonary:     Effort: Pulmonary effort is normal. No respiratory distress.     Breath sounds: Normal breath sounds.  Abdominal:     Palpations: Abdomen is soft.     Tenderness: There is abdominal tenderness in the right lower quadrant. There is right CVA tenderness. There is no left CVA tenderness.  Musculoskeletal:     Cervical back: Neck supple.  Skin:    General: Skin is warm and dry.     Capillary Refill: Capillary refill takes 2 to 3 seconds.  Neurological:     Mental Status: She is alert and oriented to person, place, and time.  Psychiatric:        Mood and Affect: Mood normal.    GU exam remarkable for  some purulent drainage from closed os.  There is no significant friability erythema or bleeding on exam with os.  Vaginal vault is otherwise unremarkable.  No significant adnexal tenderness     My findings today: Ultrasound findings again show right dilated fallopian tube likely consistent with TOA/hydrosalpinx.  This is relatively unchanged from her last ultrasound.  She was placed on antibiotics yesterday and through the night and appears well today.                                                 Her abdomen is soft, nt, non-distended                                                  Although the patient somehow met the sepsis protocol she did not ever appear septic.  Her blood pressures were stable, she was afebrile, taking p.o. without difficulty throughout her stay, ambulatory and in no evidence of acute distress.    ASSESSMENT:  1.  Hydrosalpinx  Pelvic pain - Plan: US PELVIC COMPLETE W TRANSVAGINAL AND TORSION R/O, US PELVIC COMPLETE W TRANSVAGINAL AND TORSION R/O  Sepsis, due to unspecified organism, unspecified whether acute organ dysfunction present ([place])  PLAN 1. Current condition and above findings reviewed with the patient and her husband via interpreter. 2. Discharge home with continuation of oral antibiotics 3. She already has an appointment scheduled with Dr. Marcelline Mates in 3 weeks and I believe this is likely appropriate follow-up for her hydrosalpinx.  To consider surgical removal in the future.  Jeannie Fend ,MD 02/03/2021,8:29 AM

## 2021-02-04 LAB — CULTURE, BLOOD (SINGLE)
Culture: NO GROWTH
Special Requests: ADEQUATE

## 2021-03-03 ENCOUNTER — Encounter: Payer: Self-pay | Admitting: Obstetrics and Gynecology

## 2021-03-03 ENCOUNTER — Other Ambulatory Visit: Payer: Self-pay

## 2021-03-03 ENCOUNTER — Ambulatory Visit (INDEPENDENT_AMBULATORY_CARE_PROVIDER_SITE_OTHER): Payer: Self-pay | Admitting: Obstetrics and Gynecology

## 2021-03-03 VITALS — BP 131/81 | HR 92 | Ht 61.0 in | Wt 153.8 lb

## 2021-03-03 DIAGNOSIS — N83201 Unspecified ovarian cyst, right side: Secondary | ICD-10-CM

## 2021-03-03 DIAGNOSIS — N7011 Chronic salpingitis: Secondary | ICD-10-CM

## 2021-03-03 NOTE — H&P (Signed)
GYNECOLOGY PREOPERATIVE HISTORY AND PHYSICAL   Subjective:  Brittany Richards is a 40 y.o. [phone] here for surgical management of right hydrosalpinx, history of tubo-ovarian abscess. Also with simple small right ovarian cyst.   Indications for procedure include: recurrent admissions for treatment of hydrosalpinx. No significant preoperative concerns.  Proposed surgery: Laparoscopic right salpingectomy.   Pertinent Gynecological History: Menses: flow is moderate and regular every month without intermenstrual spotting Contraception:  None Last mammogram: normal Date: 10/15/2020 Last pap: normal Date: 07/17/2020   Past Medical History:  Diagnosis Date   Anemia    Hydrosalpinx     Past Surgical History:  Procedure Laterality Date   EXCISION OF ABDOMINAL WALL TUMOR N/A 11/13/2019   Procedure: EXCISION OF ABDOMINAL WALL TUMOR;  Surgeon: Jules Husbands, MD;  Location: [facility] ORS;  Service: General;  Laterality: N/A;    OB History  Gravida Para Term Preterm AB Living  4 3 3  0 1 3  SAB IAB Ectopic Multiple Live Births  1       3    # Outcome Date GA Lbr Len/2nd Weight Sex Delivery Anes PTL Lv  4 Term 2017    M Vag-Spont   LIV  3 Term 2001    F Vag-Spont   LIV  2 SAB           1 Term     F Vag-Spont   LIV    Family History  Problem Relation Age of Onset   Heart attack Mother    Hypertension Mother    Healthy Father    Breast cancer Neg Hx    Ovarian cancer Neg Hx     Social History   Socioeconomic History   Marital status: Single    Spouse name: Not on file   Number of children: Not on file   Years of education: Not on file   Highest education level: Not on file  Occupational History   Not on file  Tobacco Use   Smoking status: Never   Smokeless tobacco: Never  Vaping Use   Vaping Use: Never used  Substance and Sexual Activity   Alcohol use: No   Drug use: No   Sexual activity: Yes    Birth control/protection: None  Other Topics Concern   Not on file   Social History Narrative   Not on file   Social Determinants of Health   Financial Resource Strain: Not on file  Food Insecurity: Not on file  Transportation Needs: Not on file  Physical Activity: Not on file  Stress: Not on file  Social Connections: Not on file  Intimate Partner Violence: Not on file   No current outpatient medications on file prior to visit.   No current facility-administered medications on file prior to visit.    No Known Allergies    Review of Systems Constitutional: No recent fever/chills/sweats Respiratory: No recent cough/bronchitis Cardiovascular: No chest pain Gastrointestinal: No recent nausea/vomiting/diarrhea Genitourinary: No UTI symptoms Hematologic/lymphatic:No history of coagulopathy or recent blood thinner use    Objective:   Blood pressure 131/81, pulse 92, height 5\' 1"  (1.549 m), weight 153 lb 12.8 oz (69.8 kg), last menstrual period 02/11/2021, currently breastfeeding. CONSTITUTIONAL: Well-developed, well-nourished female in no acute distress.  HENT:  Normocephalic, atraumatic, External right and left ear normal. Oropharynx is clear and moist EYES: Conjunctivae and EOM are normal. Pupils are equal, round, and reactive to light. No scleral icterus.  NECK: Normal range of motion, supple, no masses SKIN: Skin  is warm and dry. No rash noted. Not diaphoretic. No erythema. No pallor. NEUROLOGIC: Alert and oriented to person, place, and time. Normal reflexes, muscle tone coordination. No cranial nerve deficit noted. PSYCHIATRIC: Normal mood and affect. Normal behavior. Normal judgment and thought content. CARDIOVASCULAR: Normal heart rate noted, regular rhythm RESPIRATORY: Effort and breath sounds normal, no problems with respiration noted ABDOMEN: Soft, nontender, nondistended. PELVIC: Deferred MUSCULOSKELETAL: Normal range of motion. No edema and no tenderness. 2+ distal pulses.    Labs: Lab Results  Component Value Date   WBC 17.5  (H) 01/30/2021   HGB 12.2 01/30/2021   HCT 34.5 (L) 01/30/2021   MCV 90.8 01/30/2021   PLT 348 01/30/2021     Imaging Studies: US PELVIC COMPLETE W TRANSVAGINAL AND TORSION R/O CLINICAL DATA:  Right pelvic pain  EXAM: TRANSABDOMINAL AND TRANSVAGINAL ULTRASOUND OF PELVIS  DOPPLER ULTRASOUND OF OVARIES  TECHNIQUE: Both transabdominal and transvaginal ultrasound examinations of the pelvis were performed. Transabdominal technique was performed for global imaging of the pelvis including uterus, ovaries, adnexal regions, and pelvic cul-de-sac.  It was necessary to proceed with endovaginal exam following the transabdominal exam to visualize the endometrium and ovaries. Color and duplex Doppler ultrasound was utilized to evaluate blood flow to the ovaries.  COMPARISON:  Pelvic ultrasound 10/23/2020, same-day CT abdomen/pelvis  FINDINGS: Uterus  Measurements: 11.2 cm x 4.8 cm x 6.1 cm = volume: 174 mL. Two uterine fibroids are identified measuring 2.5 cm x 2.3 cm in the posterior uterine wall and 1.3 cm x 1.0 cm in the anterior uterine wall.  Endometrium  Thickness: 10 mm.  No focal abnormality visualized.  Right ovary  Measurements: 3.8 cm x 2.8 cm x 2.9 cm = volume: 15.8 mL. A right ovarian cyst is seen measuring 2.5 cm x 2.1 cm x 2.6 cm. There is persistent right hydrosalpinx within the fallopian tube grossly similar in caliber to the prior ultrasound from 10/23/2020.  Left ovary  Measurements: 3.1 cm x 1.5 cm x 2.1 cm = volume: 5.2 mL. Normal appearance/no adnexal mass.  Pulsed Doppler evaluation of both ovaries demonstrates normal low-resistance arterial and venous waveforms.  Other findings  No abnormal free fluid.  IMPRESSION: 1. 2.6 cm simple right ovarian cyst with persistent right hydrosalpinx; the fallopian tube grossly similar in caliber to the prior ultrasound. 2. No evidence of torsion. 3. Fibroid uterus.  Electronically Signed   By: Valetta Mole M.D.   On: 01/30/2021 16:49 CT ABDOMEN PELVIS W CONTRAST CLINICAL DATA:  Acute right lower quadrant abdominal pain, worse today.  EXAM: CT ABDOMEN AND PELVIS WITH CONTRAST  TECHNIQUE: Multidetector CT imaging of the abdomen and pelvis was performed using the standard protocol following bolus administration of intravenous contrast.  CONTRAST:  186mL OMNIPAQUE IOHEXOL 350 MG/ML SOLN  COMPARISON:  08/20/2020; 11/27/2019; 10/13/2019; pelvic ultrasound-10/23/2020; 08/20/2020  FINDINGS: Lower chest: Limited visualization of the lower thorax is degraded secondary to patient respiratory artifact. There is minimal subsegmental atelectasis involving the inferior aspect of the lingula. No discrete focal airspace opacities.  Hepatobiliary: Normal hepatic contour there is a punctate subcentimeter hypoattenuating lesion within the right lobe of the liver (image 12, series 2), which is too small to accurately characterize though favored to represent a hepatic cyst. There is a minimal amount of focal fatty infiltration adjacent to the fissure for the ligamentum teres. Note is made of an approximately 0.5 cm stone within the neck of an otherwise normal-appearing gallbladder. No gallbladder wall thickening or pericholecystic stranding. No intra  or extrahepatic biliary duct dilatation. No ascites.  Pancreas: Normal appearance of the pancreas.  Spleen: Normal appearance of the spleen. Note is made of a small splenule about the anterior tip of the spleen.  Adrenals/Urinary Tract: There is symmetric enhancement and excretion of the bilateral kidneys. No evidence of nephrolithiasis on this postcontrast examination. Subcentimeter bilateral hypoattenuating renal lesions are too small to adequately characterize though similar to the 09/2019 examination and favored to represent renal cysts. No urinary obstruction or perinephric stranding.  Normal appearance of the bilateral adrenal  glands.  Normal appearance of the urinary bladder given underdistention.  Stomach/Bowel: Moderate colonic stool burden without evidence of enteric obstruction. Normal appearance of the terminal ileum and appendix. No discrete areas of bowel wall thickening. No hiatal hernia. No pneumoperitoneum, pneumatosis or portal venous gas.  Vascular/Lymphatic: Normal caliber the abdominal aorta. The major branch vessels of the abdominal aorta appear patent on this non CTA examination.  No bulky retroperitoneal, mesenteric, pelvic or inguinal lymphadenopathy.  Reproductive: Note is made of an approximately 2.7 x 2.5 x 2.2 cm right-sided presumably physiologic adnexal cyst (axial image 61, series 2; coronal image 45, series 5). This finding is associated with worsening fluid distension of right-sided hydrosalpinx with the right-sided fallopian tube measuring approximately 3.2 cm in diameter (axial image 57, series 2). There is a minimal amount of fluid within the pelvic cul-de-sac without definitive adnexal stranding.  No discrete left-sided adnexal lesions. Interval removal of intrauterine device. There is a trace amount of free fluid in the pelvic cul-de-sac.  Other: Tiny mesenteric fat containing periumbilical hernia.  Post resection left external oblique nodule without evidence of recurrence.  Musculoskeletal: No acute or aggressive osseous abnormalities.  IMPRESSION: 1. Note is made of an approximately 2.7 cm right-sided presumably physiologic adnexal cyst with findings of right-sided hydrosalpinx. The right-sided hydrosalpinx appears more fluid distending compared to abdominal CT performed 08/20/2020. There is a minimal amount of presumably physiologic fluid in the pelvic cul-de-sac without definitive peri-adnexal stranding. 2. Otherwise, no explanation for patient's right lower quadrant abdominal pain. Specifically, no evidence of enteric or urinary obstruction. Normal  appearance of the appendix. 3. Interval removal of intrauterine device. 4. Cholelithiasis without evidence of cholecystitis. 5. Post resection left external oblique nodule without evidence of recurrence.  Electronically Signed   By: Sandi Mariscal M.D.   On: 01/30/2021 12:57   Assessment:    1. Hydrosalpinx   2. Right ovarian cyst     Plan:    Counseling: Procedure, risks, reasons, benefits and complications (including injury to bowel, bladder, major blood vessel, ureter, bleeding, possibility of transfusion, infection, or fistula formation) reviewed in detail. Likelihood of success in alleviating the patient's condition was discussed. Routine postoperative instructions will be reviewed with the patient and her family in detail after surgery.  The patient concurred with the proposed plan, giving informed written consent for the surgery.   Preop testing ordered. Instructions reviewed, including NPO after midnight.     Rubie Maid, MD [facility]

## 2021-03-03 NOTE — Progress Notes (Signed)
GYNECOLOGY PROGRESS NOTE  Subjective:    Patient ID: Brittany Richards, female    DOB: 10-29-1980, 40 y.o.   MRN: [id number]  Spanish Interpreter used for today's visit.  HPI  Patient is a 40 y.o. [phone] female who presents for follow up from Emergency Room for hydrosalpinx and right ovarian cyst.  Patient has a history of admission earlier this year in March for hydrosalpinx with suspected tubo-ovarian abscess.  She reported again to the emergency room last month on 01/30/2021 for complaints of right lower quadrant abdominal pain x4 days.  She was admitted and received treatment with IV antibiotics during that time due to meeting SIRS criteria (tachycardia, leukocytosis with WBC count of 17.5).   Patient presents now for further management.  We had previously discussed that if patient had multiple episodes of infection requiring antibiotics, that she would likely need to have removal of that tube, especially as patient is still considering childbearing.  She currently denies any abnormal vaginal bleeding abnormal vaginal discharge, or pelvic pain.  The following portions of the patient's history were reviewed and updated as appropriate: allergies, current medications, past family history, past medical history, past social history, past surgical history, and problem list.  Review of Systems Pertinent items noted in HPI and remainder of comprehensive ROS otherwise negative.   Objective:   Blood pressure 131/81, pulse 92, height 5\' 1"  (1.549 m), weight 153 lb 12.8 oz (69.8 kg), last menstrual period 02/11/2021, currently breastfeeding. Body mass index is 29.06 kg/m. General appearance: alert and no distress Abdomen: soft, non-tender; bowel sounds normal; no masses,  no organomegaly Pelvic: deferred   Labs:  Lab Results  Component Value Date   WBC 17.5 (H) 01/30/2021   HGB 12.2 01/30/2021   HCT 34.5 (L) 01/30/2021   MCV 90.8 01/30/2021   PLT 348 01/30/2021    Imaging:  US PELVIC  COMPLETE W TRANSVAGINAL AND TORSION R/O CLINICAL DATA:  Right pelvic pain  EXAM: TRANSABDOMINAL AND TRANSVAGINAL ULTRASOUND OF PELVIS  DOPPLER ULTRASOUND OF OVARIES  TECHNIQUE: Both transabdominal and transvaginal ultrasound examinations of the pelvis were performed. Transabdominal technique was performed for global imaging of the pelvis including uterus, ovaries, adnexal regions, and pelvic cul-de-sac.  It was necessary to proceed with endovaginal exam following the transabdominal exam to visualize the endometrium and ovaries. Color and duplex Doppler ultrasound was utilized to evaluate blood flow to the ovaries.  COMPARISON:  Pelvic ultrasound 10/23/2020, same-day CT abdomen/pelvis  FINDINGS: Uterus  Measurements: 11.2 cm x 4.8 cm x 6.1 cm = volume: 174 mL. Two uterine fibroids are identified measuring 2.5 cm x 2.3 cm in the posterior uterine wall and 1.3 cm x 1.0 cm in the anterior uterine wall.  Endometrium  Thickness: 10 mm.  No focal abnormality visualized.  Right ovary  Measurements: 3.8 cm x 2.8 cm x 2.9 cm = volume: 15.8 mL. A right ovarian cyst is seen measuring 2.5 cm x 2.1 cm x 2.6 cm. There is persistent right hydrosalpinx within the fallopian tube grossly similar in caliber to the prior ultrasound from 10/23/2020.  Left ovary  Measurements: 3.1 cm x 1.5 cm x 2.1 cm = volume: 5.2 mL. Normal appearance/no adnexal mass.  Pulsed Doppler evaluation of both ovaries demonstrates normal low-resistance arterial and venous waveforms.  Other findings  No abnormal free fluid.  IMPRESSION: 1. 2.6 cm simple right ovarian cyst with persistent right hydrosalpinx; the fallopian tube grossly similar in caliber to the prior ultrasound. 2. No evidence of torsion. 3.  Fibroid uterus.  Electronically Signed   By: Valetta Mole M.D.   On: 01/30/2021 16:49 CT ABDOMEN PELVIS W CONTRAST CLINICAL DATA:  Acute right lower quadrant abdominal pain,  worse today.  EXAM: CT ABDOMEN AND PELVIS WITH CONTRAST  TECHNIQUE: Multidetector CT imaging of the abdomen and pelvis was performed using the standard protocol following bolus administration of intravenous contrast.  CONTRAST:  126mL OMNIPAQUE IOHEXOL 350 MG/ML SOLN  COMPARISON:  08/20/2020; 11/27/2019; 10/13/2019; pelvic ultrasound-10/23/2020; 08/20/2020  FINDINGS: Lower chest: Limited visualization of the lower thorax is degraded secondary to patient respiratory artifact. There is minimal subsegmental atelectasis involving the inferior aspect of the lingula. No discrete focal airspace opacities.  Hepatobiliary: Normal hepatic contour there is a punctate subcentimeter hypoattenuating lesion within the right lobe of the liver (image 12, series 2), which is too small to accurately characterize though favored to represent a hepatic cyst. There is a minimal amount of focal fatty infiltration adjacent to the fissure for the ligamentum teres. Note is made of an approximately 0.5 cm stone within the neck of an otherwise normal-appearing gallbladder. No gallbladder wall thickening or pericholecystic stranding. No intra or extrahepatic biliary duct dilatation. No ascites.  Pancreas: Normal appearance of the pancreas.  Spleen: Normal appearance of the spleen. Note is made of a small splenule about the anterior tip of the spleen.  Adrenals/Urinary Tract: There is symmetric enhancement and excretion of the bilateral kidneys. No evidence of nephrolithiasis on this postcontrast examination. Subcentimeter bilateral hypoattenuating renal lesions are too small to adequately characterize though similar to the 09/2019 examination and favored to represent renal cysts. No urinary obstruction or perinephric stranding.  Normal appearance of the bilateral adrenal glands.  Normal appearance of the urinary bladder given underdistention.  Stomach/Bowel: Moderate colonic stool burden without  evidence of enteric obstruction. Normal appearance of the terminal ileum and appendix. No discrete areas of bowel wall thickening. No hiatal hernia. No pneumoperitoneum, pneumatosis or portal venous gas.  Vascular/Lymphatic: Normal caliber the abdominal aorta. The major branch vessels of the abdominal aorta appear patent on this non CTA examination.  No bulky retroperitoneal, mesenteric, pelvic or inguinal lymphadenopathy.  Reproductive: Note is made of an approximately 2.7 x 2.5 x 2.2 cm right-sided presumably physiologic adnexal cyst (axial image 61, series 2; coronal image 45, series 5). This finding is associated with worsening fluid distension of right-sided hydrosalpinx with the right-sided fallopian tube measuring approximately 3.2 cm in diameter (axial image 57, series 2). There is a minimal amount of fluid within the pelvic cul-de-sac without definitive adnexal stranding.  No discrete left-sided adnexal lesions. Interval removal of intrauterine device. There is a trace amount of free fluid in the pelvic cul-de-sac.  Other: Tiny mesenteric fat containing periumbilical hernia.  Post resection left external oblique nodule without evidence of recurrence.  Musculoskeletal: No acute or aggressive osseous abnormalities.  IMPRESSION: 1. Note is made of an approximately 2.7 cm right-sided presumably physiologic adnexal cyst with findings of right-sided hydrosalpinx. The right-sided hydrosalpinx appears more fluid distending compared to abdominal CT performed 08/20/2020. There is a minimal amount of presumably physiologic fluid in the pelvic cul-de-sac without definitive peri-adnexal stranding. 2. Otherwise, no explanation for patient's right lower quadrant abdominal pain. Specifically, no evidence of enteric or urinary obstruction. Normal appearance of the appendix. 3. Interval removal of intrauterine device. 4. Cholelithiasis without evidence of cholecystitis. 5. Post  resection left external oblique nodule without evidence of recurrence.  Electronically Signed   By: Sandi Mariscal M.D.   On: 01/30/2021  12:57  Assessment:   1. Hydrosalpinx   2. Right ovarian cyst      Plan:   1. Hydrosalpinx - Patient is for surgical management with right salpingectomy for hydrosalpinx and recurrent infections.  The risks of surgery were discussed in detail with the patient including but not limited to: bleeding which may require transfusion or reoperation; infection which may require prolonged hospitalization or re-hospitalization and antibiotic therapy; injury to bowel, bladder, ureters and major vessels or other surrounding organs; formation of adhesions; need for additional procedures including laparotomy or subsequent procedures secondary to abnormal pathology; thromboembolic phenomenon; incisional problems and other postoperative or anesthesia complications.  Patient was told that the likelihood that her condition and symptoms will be treated effectively with this surgical management was very high; the postoperative expectations were also discussed in detail. The patient also understands the alternative treatment options which were discussed in full. All questions were answered.  She was told that she will be contacted by our surgical scheduler regarding the time and date of her surgery; routine preoperative instructions will be given to her by the preoperative nursing team.   She is aware of need for preoperative COVID testing and subsequent quarantine from time of test to time of surgery; she will be given further preoperative instructions at that [place] screening visit.  Printed patient education handouts about the procedure were given to the patient to review at home.  Surgery to be scheduled for 03/16/2021.    2. Right ovarian cyst -Patient with small right ovarian cyst, has been persistent over recent scans.  Discussed that we could likely manage cyst with aspiration or  removal at time of surgical intervention for hydrosalpinx.   A total of 25 minutes were spent face-to-face with the patient during this encounter and over half of that time involved counseling and coordination of care.   Rubie Maid, MD [facility]

## 2021-03-03 NOTE — H&P (View-Only) (Signed)
GYNECOLOGY PREOPERATIVE HISTORY AND PHYSICAL   Subjective:  Brittany Richards is a 40 y.o. [phone] here for surgical management of right hydrosalpinx, history of tubo-ovarian abscess. Also with simple small right ovarian cyst.   Indications for procedure include: recurrent admissions for treatment of hydrosalpinx. No significant preoperative concerns.  Proposed surgery: Laparoscopic right salpingectomy.   Pertinent Gynecological History: Menses: flow is moderate and regular every month without intermenstrual spotting Contraception:  None Last mammogram: normal Date: 10/15/2020 Last pap: normal Date: 07/17/2020   Past Medical History:  Diagnosis Date   Anemia    Hydrosalpinx     Past Surgical History:  Procedure Laterality Date   EXCISION OF ABDOMINAL WALL TUMOR N/A 11/13/2019   Procedure: EXCISION OF ABDOMINAL WALL TUMOR;  Surgeon: Jules Husbands, MD;  Location: [facility] ORS;  Service: General;  Laterality: N/A;    OB History  Gravida Para Term Preterm AB Living  4 3 3  0 1 3  SAB IAB Ectopic Multiple Live Births  1       3    # Outcome Date GA Lbr Len/2nd Weight Sex Delivery Anes PTL Lv  4 Term 2017    M Vag-Spont   LIV  3 Term 2001    F Vag-Spont   LIV  2 SAB           1 Term     F Vag-Spont   LIV    Family History  Problem Relation Age of Onset   Heart attack Mother    Hypertension Mother    Healthy Father    Breast cancer Neg Hx    Ovarian cancer Neg Hx     Social History   Socioeconomic History   Marital status: Single    Spouse name: Not on file   Number of children: Not on file   Years of education: Not on file   Highest education level: Not on file  Occupational History   Not on file  Tobacco Use   Smoking status: Never   Smokeless tobacco: Never  Vaping Use   Vaping Use: Never used  Substance and Sexual Activity   Alcohol use: No   Drug use: No   Sexual activity: Yes    Birth control/protection: None  Other Topics Concern   Not on file   Social History Narrative   Not on file   Social Determinants of Health   Financial Resource Strain: Not on file  Food Insecurity: Not on file  Transportation Needs: Not on file  Physical Activity: Not on file  Stress: Not on file  Social Connections: Not on file  Intimate Partner Violence: Not on file   No current outpatient medications on file prior to visit.   No current facility-administered medications on file prior to visit.    No Known Allergies    Review of Systems Constitutional: No recent fever/chills/sweats Respiratory: No recent cough/bronchitis Cardiovascular: No chest pain Gastrointestinal: No recent nausea/vomiting/diarrhea Genitourinary: No UTI symptoms Hematologic/lymphatic:No history of coagulopathy or recent blood thinner use    Objective:   Blood pressure 131/81, pulse 92, height 5\' 1"  (1.549 m), weight 153 lb 12.8 oz (69.8 kg), last menstrual period 02/11/2021, currently breastfeeding. CONSTITUTIONAL: Well-developed, well-nourished female in no acute distress.  HENT:  Normocephalic, atraumatic, External right and left ear normal. Oropharynx is clear and moist EYES: Conjunctivae and EOM are normal. Pupils are equal, round, and reactive to light. No scleral icterus.  NECK: Normal range of motion, supple, no masses SKIN: Skin  is warm and dry. No rash noted. Not diaphoretic. No erythema. No pallor. NEUROLOGIC: Alert and oriented to person, place, and time. Normal reflexes, muscle tone coordination. No cranial nerve deficit noted. PSYCHIATRIC: Normal mood and affect. Normal behavior. Normal judgment and thought content. CARDIOVASCULAR: Normal heart rate noted, regular rhythm RESPIRATORY: Effort and breath sounds normal, no problems with respiration noted ABDOMEN: Soft, nontender, nondistended. PELVIC: Deferred MUSCULOSKELETAL: Normal range of motion. No edema and no tenderness. 2+ distal pulses.    Labs: Lab Results  Component Value Date   WBC 17.5  (H) 01/30/2021   HGB 12.2 01/30/2021   HCT 34.5 (L) 01/30/2021   MCV 90.8 01/30/2021   PLT 348 01/30/2021     Imaging Studies: US PELVIC COMPLETE W TRANSVAGINAL AND TORSION R/O CLINICAL DATA:  Right pelvic pain  EXAM: TRANSABDOMINAL AND TRANSVAGINAL ULTRASOUND OF PELVIS  DOPPLER ULTRASOUND OF OVARIES  TECHNIQUE: Both transabdominal and transvaginal ultrasound examinations of the pelvis were performed. Transabdominal technique was performed for global imaging of the pelvis including uterus, ovaries, adnexal regions, and pelvic cul-de-sac.  It was necessary to proceed with endovaginal exam following the transabdominal exam to visualize the endometrium and ovaries. Color and duplex Doppler ultrasound was utilized to evaluate blood flow to the ovaries.  COMPARISON:  Pelvic ultrasound 10/23/2020, same-day CT abdomen/pelvis  FINDINGS: Uterus  Measurements: 11.2 cm x 4.8 cm x 6.1 cm = volume: 174 mL. Two uterine fibroids are identified measuring 2.5 cm x 2.3 cm in the posterior uterine wall and 1.3 cm x 1.0 cm in the anterior uterine wall.  Endometrium  Thickness: 10 mm.  No focal abnormality visualized.  Right ovary  Measurements: 3.8 cm x 2.8 cm x 2.9 cm = volume: 15.8 mL. A right ovarian cyst is seen measuring 2.5 cm x 2.1 cm x 2.6 cm. There is persistent right hydrosalpinx within the fallopian tube grossly similar in caliber to the prior ultrasound from 10/23/2020.  Left ovary  Measurements: 3.1 cm x 1.5 cm x 2.1 cm = volume: 5.2 mL. Normal appearance/no adnexal mass.  Pulsed Doppler evaluation of both ovaries demonstrates normal low-resistance arterial and venous waveforms.  Other findings  No abnormal free fluid.  IMPRESSION: 1. 2.6 cm simple right ovarian cyst with persistent right hydrosalpinx; the fallopian tube grossly similar in caliber to the prior ultrasound. 2. No evidence of torsion. 3. Fibroid uterus.  Electronically Signed   By: Valetta Mole M.D.   On: 01/30/2021 16:49 CT ABDOMEN PELVIS W CONTRAST CLINICAL DATA:  Acute right lower quadrant abdominal pain, worse today.  EXAM: CT ABDOMEN AND PELVIS WITH CONTRAST  TECHNIQUE: Multidetector CT imaging of the abdomen and pelvis was performed using the standard protocol following bolus administration of intravenous contrast.  CONTRAST:  168mL OMNIPAQUE IOHEXOL 350 MG/ML SOLN  COMPARISON:  08/20/2020; 11/27/2019; 10/13/2019; pelvic ultrasound-10/23/2020; 08/20/2020  FINDINGS: Lower chest: Limited visualization of the lower thorax is degraded secondary to patient respiratory artifact. There is minimal subsegmental atelectasis involving the inferior aspect of the lingula. No discrete focal airspace opacities.  Hepatobiliary: Normal hepatic contour there is a punctate subcentimeter hypoattenuating lesion within the right lobe of the liver (image 12, series 2), which is too small to accurately characterize though favored to represent a hepatic cyst. There is a minimal amount of focal fatty infiltration adjacent to the fissure for the ligamentum teres. Note is made of an approximately 0.5 cm stone within the neck of an otherwise normal-appearing gallbladder. No gallbladder wall thickening or pericholecystic stranding. No intra  or extrahepatic biliary duct dilatation. No ascites.  Pancreas: Normal appearance of the pancreas.  Spleen: Normal appearance of the spleen. Note is made of a small splenule about the anterior tip of the spleen.  Adrenals/Urinary Tract: There is symmetric enhancement and excretion of the bilateral kidneys. No evidence of nephrolithiasis on this postcontrast examination. Subcentimeter bilateral hypoattenuating renal lesions are too small to adequately characterize though similar to the 09/2019 examination and favored to represent renal cysts. No urinary obstruction or perinephric stranding.  Normal appearance of the bilateral adrenal  glands.  Normal appearance of the urinary bladder given underdistention.  Stomach/Bowel: Moderate colonic stool burden without evidence of enteric obstruction. Normal appearance of the terminal ileum and appendix. No discrete areas of bowel wall thickening. No hiatal hernia. No pneumoperitoneum, pneumatosis or portal venous gas.  Vascular/Lymphatic: Normal caliber the abdominal aorta. The major branch vessels of the abdominal aorta appear patent on this non CTA examination.  No bulky retroperitoneal, mesenteric, pelvic or inguinal lymphadenopathy.  Reproductive: Note is made of an approximately 2.7 x 2.5 x 2.2 cm right-sided presumably physiologic adnexal cyst (axial image 61, series 2; coronal image 45, series 5). This finding is associated with worsening fluid distension of right-sided hydrosalpinx with the right-sided fallopian tube measuring approximately 3.2 cm in diameter (axial image 57, series 2). There is a minimal amount of fluid within the pelvic cul-de-sac without definitive adnexal stranding.  No discrete left-sided adnexal lesions. Interval removal of intrauterine device. There is a trace amount of free fluid in the pelvic cul-de-sac.  Other: Tiny mesenteric fat containing periumbilical hernia.  Post resection left external oblique nodule without evidence of recurrence.  Musculoskeletal: No acute or aggressive osseous abnormalities.  IMPRESSION: 1. Note is made of an approximately 2.7 cm right-sided presumably physiologic adnexal cyst with findings of right-sided hydrosalpinx. The right-sided hydrosalpinx appears more fluid distending compared to abdominal CT performed 08/20/2020. There is a minimal amount of presumably physiologic fluid in the pelvic cul-de-sac without definitive peri-adnexal stranding. 2. Otherwise, no explanation for patient's right lower quadrant abdominal pain. Specifically, no evidence of enteric or urinary obstruction. Normal  appearance of the appendix. 3. Interval removal of intrauterine device. 4. Cholelithiasis without evidence of cholecystitis. 5. Post resection left external oblique nodule without evidence of recurrence.  Electronically Signed   By: Sandi Mariscal M.D.   On: 01/30/2021 12:57   Assessment:    1. Hydrosalpinx   2. Right ovarian cyst     Plan:    Counseling: Procedure, risks, reasons, benefits and complications (including injury to bowel, bladder, major blood vessel, ureter, bleeding, possibility of transfusion, infection, or fistula formation) reviewed in detail. Likelihood of success in alleviating the patient's condition was discussed. Routine postoperative instructions will be reviewed with the patient and her family in detail after surgery.  The patient concurred with the proposed plan, giving informed written consent for the surgery.   Preop testing ordered. Instructions reviewed, including NPO after midnight.     Rubie Maid, MD [facility]

## 2021-03-12 ENCOUNTER — Encounter
Admission: RE | Admit: 2021-03-12 | Discharge: 2021-03-12 | Disposition: A | Discharge status: Home / Self Care | Payer: Self-pay | Source: Ambulatory Visit | Attending: Obstetrics and Gynecology | Admitting: Obstetrics and Gynecology

## 2021-03-12 ENCOUNTER — Encounter: Payer: Self-pay | Admitting: Urgent Care

## 2021-03-12 ENCOUNTER — Other Ambulatory Visit
Admission: RE | Admit: 2021-03-12 | Discharge: 2021-03-12 | Disposition: A | Discharge status: Home / Self Care | Payer: Self-pay | Source: Ambulatory Visit | Attending: Obstetrics and Gynecology | Admitting: Obstetrics and Gynecology

## 2021-03-12 ENCOUNTER — Other Ambulatory Visit: Payer: Self-pay

## 2021-03-12 DIAGNOSIS — Z01812 Encounter for preprocedural laboratory examination: Secondary | ICD-10-CM | POA: Insufficient documentation

## 2021-03-12 DIAGNOSIS — N7011 Chronic salpingitis: Secondary | ICD-10-CM | POA: Insufficient documentation

## 2021-03-12 LAB — CBC
HCT: 38.7 % (ref 36.0–46.0)
Hemoglobin: 13.3 g/dL (ref 12.0–15.0)
MCH: 31.1 pg (ref 26.0–34.0)
MCHC: 34.4 g/dL (ref 30.0–36.0)
MCV: 90.4 fL (ref 80.0–100.0)
Platelets: 308 10*3/uL (ref 150–400)
RBC: 4.28 MIL/uL (ref 3.87–5.11)
RDW: 13.9 % (ref 11.5–15.5)
WBC: 9 10*3/uL (ref 4.0–10.5)
nRBC: 0 % (ref 0.0–0.2)

## 2021-03-12 NOTE — Pre-Procedure Instructions (Signed)
[place] Shirlee [place] # [phone]) used for interview and instructions.

## 2021-03-12 NOTE — Patient Instructions (Signed)
Your procedure is scheduled on: 03/16/21 Report to [facility]. To find out your arrival time please call [phone] between 1PM - 3PM on 03/13/21.  Remember: Instructions that are not followed completely may result in serious medical risk, up to and including death, or upon the discretion of your surgeon and anesthesiologist your surgery may need to be rescheduled.     _X__ 1. Do not eat food or drink any liquids after midnight the night before your procedure.                 No gum chewing or hard candies.   __X__2.  On the morning of surgery brush your teeth with toothpaste and water, you                 may rinse your mouth with mouthwash if you wish.  Do not swallow any              toothpaste of mouthwash.     _X__ 3.  No Alcohol for 24 hours before or after surgery.   _X__ 4.  Do Not Smoke or use e-cigarettes For 24 Hours Prior to Your Surgery.                 Do not use any chewable tobacco products for at least 6 hours prior to                 surgery.  ____  5.  Bring all medications with you on the day of surgery if instructed.   __X__  6.  Notify your doctor if there is any change in your medical condition      (cold, fever, infections).     Do not wear jewelry, make-up, hairpins, clips or nail polish. Do not wear lotions, powders, or perfumes.  Do not shave 48 hours prior to surgery. Men may shave face and neck. Do not bring valuables to the hospital.    [facility] is not responsible for any belongings or valuables.  Contacts, dentures/partials or body piercings may not be worn into surgery. Bring a case for your contacts, glasses or hearing aids, a denture cup will be supplied. Leave your suitcase in the car. After surgery it may be brought to your room. For patients admitted to the hospital, discharge time is determined by your treatment team.   Patients discharged the day of surgery will not be allowed to  drive home.   Please read over the following fact sheets that you were given:   CHG soap  __X__ Take these medicines the morning of surgery with A SIP OF WATER:    1. none  2.   3.   4.  5.  6.  ____ Fleet Enema (as directed)   __X__ Use CHG Soap see instructions  ____ Use inhalers on the day of surgery  ____ Stop metformin/Janumet/Farxiga 2 days prior to surgery    ____ Take 1/2 of usual insulin dose the night before surgery. No insulin the morning          of surgery.   ____ Stop Blood Thinners Coumadin/Plavix/Xarelto/Pleta/Pradaxa/Eliquis/Effient/Aspirin  on   Or contact your Surgeon, Cardiologist or Medical Doctor regarding  ability to stop your blood thinners  __X__ Stop Anti-inflammatories 7 days before surgery such as Advil, Ibuprofen, Motrin,  BC or Goodies Powder, Naprosyn, Naproxen, Aleve, Aspirin    __X__ Stop all herbal supplements, fish oil or vitamin E until  after surgery.    ____ Bring C-Pap to the hospital.    Su procedimiento est programado para: 24/10/22 Presntese en el DEPARTAMENTO DE CIRUGA DE DA UBICADO EN LA ENTRADA DEL CENTRO MDICO DEL 2. PISO. Para averiguar su hora de llegada, llame al [phone] entre la 1:00 p. m. y las 3:00 p. m. el 21/10/22.  Recuerde: las instrucciones que no se siguen por completo Heritage manager en un riesgo mdico grave, que puede incluir la [place], o segn el criterio de su cirujano y Environmental health practitioner, es posible que sea Firefighter su Leisure centre manager.    _X__ 1. No ingiera alimentos ni beba lquidos despus de la medianoche anterior a su procedimiento.                 No masticar chicle ni caramelos duros.  __X__2. En la maana de la ciruga cepllese los dientes con pasta dental y agua, [place] enjuagarse la boca con enjuague bucal si lo desea. No trague ninguna pasta de dientes o enjuague bucal.   _X__ 3. Nada de alcohol por 24 horas antes o despus de la [place].   _X__ 4. No fume ni use cigarrillos  electrnicos durante las 24 horas previas a su [place].                 No use ningn producto de tabaco masticable durante al menos 6 horas antes de                 [place].  ____ 5. Traiga todos los medicamentos con usted el da de la ciruga si se lo indicaron.  __X__ 6. Notifique a su mdico si hay algn cambio en su condicin mdica (resfriado, fiebre, infecciones).  No use joyas, maquillaje, horquillas para el cabello, clips o esmalte de uas. No use lociones, talcos ni perfumes. No se afeite 48 horas antes de la [place]. Los hombres pueden [employer] cara y el cuello. No lleve objetos de valor al hospital.  [facility] no es responsable de ninguna pertenencia u objeto de Geographical information systems officer.  No se pueden usar lentes de contacto, dentaduras postizas/parciales o perforaciones corporales en la ciruga. Traiga un estuche para sus lentes de contacto, anteojos o audfonos, se le proporcionar una copa para dentadura postiza. Deja tu maleta en el coche. Despus de la [place], es posible que lo lleven a su habitacin. Para los pacientes ingresados en el hospital, la hora del alta la determina su equipo de tratamiento  Los pacientes dados de alta el da de la ciruga no podrn Forensic psychologist a Holiday representative.  Por favor, lea las siguientes hojas informativas que le dieron: jabn CHG  __X__ [place] [employer] la maana de la ciruga con UN SORBO DE AGUA:  1. ninguno 2. 3. 4. 5. 6.  ____ Fleet Enema (segn las indicaciones)  __X__ Usar jabn CHG ver instrucciones  ____ Usar inhaladores el da de la ciruga  ____ Suspender metformina/Janumet/Farxiga 2 das antes de la ciruga  ____ [employer] mitad de la dosis habitual de insulina la noche anterior a la Leisure centre manager. Sin insulina por la maana          de [place]  ____ Suspenda los anticoagulantes Coumadin/Plavix/Xarelto/Pleta/Pradaxa/Eliquis/Effient/Aspirin on O comunquese con su cirujano, cardilogo o mdico acerca de la capacidad para Scientist, water quality sus  anticoagulantes  __X__ [employer] antiinflamatorios 7 das antes de la ciruga como Advil, Ibuprofen, Motrin, BC o Goodies Powder, Naprosyn, Naproxen, Aleve, Aspirin  __X__ Suspenda todos los suplementos de hierbas, aceite de pescado o vitamina E  hasta despus de la [place].  ____ Tressie Ellis al hospital.

## 2021-03-16 ENCOUNTER — Encounter
Admission: RE | Disposition: A | Discharge status: Home / Self Care | Payer: Self-pay | Source: Ambulatory Visit | Attending: Obstetrics and Gynecology

## 2021-03-16 ENCOUNTER — Encounter: Payer: Self-pay | Admitting: Obstetrics and Gynecology

## 2021-03-16 ENCOUNTER — Ambulatory Visit
Admission: RE | Admit: 2021-03-16 | Discharge: 2021-03-16 | Disposition: A | Discharge status: Home / Self Care | Payer: Self-pay | Source: Ambulatory Visit | Attending: Obstetrics and Gynecology | Admitting: Obstetrics and Gynecology

## 2021-03-16 ENCOUNTER — Ambulatory Visit: Payer: Self-pay | Admitting: Certified Registered"

## 2021-03-16 ENCOUNTER — Other Ambulatory Visit: Payer: Self-pay

## 2021-03-16 DIAGNOSIS — N83201 Unspecified ovarian cyst, right side: Secondary | ICD-10-CM

## 2021-03-16 DIAGNOSIS — N7011 Chronic salpingitis: Secondary | ICD-10-CM | POA: Insufficient documentation

## 2021-03-16 HISTORY — PX: LAPAROSCOPIC BILATERAL SALPINGECTOMY: SHX5889

## 2021-03-16 LAB — POCT PREGNANCY, URINE: Preg Test, Ur: NEGATIVE

## 2021-03-16 SURGERY — SALPINGECTOMY, BILATERAL, LAPAROSCOPIC
Anesthesia: General | Laterality: Right

## 2021-03-16 MED ORDER — ORAL CARE MOUTH RINSE: 15.0000 mL | Freq: Once | OROMUCOSAL | Status: AC

## 2021-03-16 MED ORDER — ACETAMINOPHEN 10 MG/ML IV SOLN
INTRAVENOUS | Status: DC | PRN
Start: 2021-03-16 — End: 2021-03-16
  Administered 2021-03-16: 1000 mg via INTRAVENOUS

## 2021-03-16 MED ORDER — FAMOTIDINE 20 MG PO TABS: 20.0000 mg | ORAL_TABLET | Freq: Once | ORAL | Status: AC

## 2021-03-16 MED ORDER — FAMOTIDINE 20 MG PO TABS
ORAL_TABLET | ORAL | Status: AC
  Administered 2021-03-16: 20 mg via ORAL
  Filled 2021-03-16: qty 1

## 2021-03-16 MED ORDER — OXYCODONE HCL 5 MG PO TABS
ORAL_TABLET | ORAL | Status: AC
  Administered 2021-03-16: 5 mg via ORAL
  Filled 2021-03-16: qty 1

## 2021-03-16 MED ORDER — LIDOCAINE HCL (PF) 2 % IJ SOLN
INTRAMUSCULAR | Status: AC
  Filled 2021-03-16: qty 5

## 2021-03-16 MED ORDER — DEXAMETHASONE SODIUM PHOSPHATE 10 MG/ML IJ SOLN
INTRAMUSCULAR | Status: DC | PRN
  Administered 2021-03-16: 5 mg via INTRAVENOUS

## 2021-03-16 MED ORDER — ACETAMINOPHEN 10 MG/ML IV SOLN
INTRAVENOUS | Status: AC
  Filled 2021-03-16: qty 100

## 2021-03-16 MED ORDER — CHLORHEXIDINE GLUCONATE 0.12 % MT SOLN
OROMUCOSAL | Status: AC
  Administered 2021-03-16: 15 mL via OROMUCOSAL
  Filled 2021-03-16: qty 15

## 2021-03-16 MED ORDER — IBUPROFEN 600 MG PO TABS: 600.0000 mg | ORAL_TABLET | Freq: Four times a day (QID) | ORAL | 1 refills | Status: DC | PRN

## 2021-03-16 MED ORDER — LIDOCAINE HCL (CARDIAC) PF 100 MG/5ML IV SOSY
PREFILLED_SYRINGE | INTRAVENOUS | Status: DC | PRN
  Administered 2021-03-16: 80 mg via INTRAVENOUS

## 2021-03-16 MED ORDER — ONDANSETRON HCL 4 MG/2ML IJ SOLN
INTRAMUSCULAR | Status: AC
  Filled 2021-03-16: qty 2

## 2021-03-16 MED ORDER — GABAPENTIN 300 MG PO CAPS: 300.0000 mg | ORAL_CAPSULE | ORAL | Status: AC

## 2021-03-16 MED ORDER — SUGAMMADEX SODIUM 200 MG/2ML IV SOLN
INTRAVENOUS | Status: DC | PRN
  Administered 2021-03-16: 180 mg via INTRAVENOUS

## 2021-03-16 MED ORDER — PROPOFOL 10 MG/ML IV BOLUS
INTRAVENOUS | Status: DC | PRN
  Administered 2021-03-16: 150 mg via INTRAVENOUS

## 2021-03-16 MED ORDER — MIDAZOLAM HCL 2 MG/2ML IJ SOLN
INTRAMUSCULAR | Status: AC
  Filled 2021-03-16: qty 2

## 2021-03-16 MED ORDER — FENTANYL CITRATE (PF) 100 MCG/2ML IJ SOLN: 25.0000 ug | INTRAMUSCULAR | Status: DC | PRN

## 2021-03-16 MED ORDER — ROCURONIUM BROMIDE 10 MG/ML (PF) SYRINGE
PREFILLED_SYRINGE | INTRAVENOUS | Status: AC
  Filled 2021-03-16: qty 10

## 2021-03-16 MED ORDER — OXYCODONE HCL 5 MG PO TABS
5.0000 mg | ORAL_TABLET | Freq: Once | ORAL | Status: AC | PRN
Start: 2021-03-16 — End: 2021-03-16

## 2021-03-16 MED ORDER — ACETAMINOPHEN 500 MG PO TABS: 1000.0000 mg | ORAL_TABLET | ORAL | Status: AC

## 2021-03-16 MED ORDER — ACETAMINOPHEN 500 MG PO TABS: 1000.0000 mg | ORAL_TABLET | Freq: Four times a day (QID) | ORAL | 1 refills | Status: DC | PRN

## 2021-03-16 MED ORDER — GABAPENTIN 300 MG PO CAPS
ORAL_CAPSULE | ORAL | Status: AC
  Administered 2021-03-16: 300 mg via ORAL
  Filled 2021-03-16: qty 1

## 2021-03-16 MED ORDER — LACTATED RINGERS IV SOLN: INTRAVENOUS | Status: DC

## 2021-03-16 MED ORDER — MIDAZOLAM HCL 2 MG/2ML IJ SOLN
INTRAMUSCULAR | Status: DC | PRN
  Administered 2021-03-16: 2 mg via INTRAVENOUS

## 2021-03-16 MED ORDER — DEXAMETHASONE SODIUM PHOSPHATE 10 MG/ML IJ SOLN
INTRAMUSCULAR | Status: AC
  Filled 2021-03-16: qty 1

## 2021-03-16 MED ORDER — OXYCODONE HCL 5 MG/5ML PO SOLN: 5.0000 mg | Freq: Once | ORAL | Status: AC | PRN

## 2021-03-16 MED ORDER — OXYCODONE HCL 5 MG PO TABS: 5.0000 mg | ORAL_TABLET | Freq: Four times a day (QID) | ORAL | 0 refills | Status: DC | PRN

## 2021-03-16 MED ORDER — FENTANYL CITRATE (PF) 100 MCG/2ML IJ SOLN
INTRAMUSCULAR | Status: AC
  Filled 2021-03-16: qty 2

## 2021-03-16 MED ORDER — 0.9 % SODIUM CHLORIDE (POUR BTL) OPTIME
TOPICAL | Status: DC | PRN
  Administered 2021-03-16: 1000 mL

## 2021-03-16 MED ORDER — ROCURONIUM BROMIDE 100 MG/10ML IV SOLN
INTRAVENOUS | Status: DC | PRN
  Administered 2021-03-16: 50 mg via INTRAVENOUS

## 2021-03-16 MED ORDER — ACETAMINOPHEN 500 MG PO TABS
ORAL_TABLET | ORAL | Status: AC
  Administered 2021-03-16: 1000 mg via ORAL
  Filled 2021-03-16: qty 2

## 2021-03-16 MED ORDER — CHLORHEXIDINE GLUCONATE 0.12 % MT SOLN: 15.0000 mL | Freq: Once | OROMUCOSAL | Status: AC

## 2021-03-16 MED ORDER — BUPIVACAINE HCL (PF) 0.5 % IJ SOLN
INTRAMUSCULAR | Status: AC
  Filled 2021-03-16: qty 30

## 2021-03-16 MED ORDER — PROMETHAZINE HCL 25 MG/ML IJ SOLN: 6.2500 mg | INTRAMUSCULAR | Status: DC | PRN

## 2021-03-16 MED ORDER — BUPIVACAINE HCL 0.5 % IJ SOLN
INTRAMUSCULAR | Status: DC | PRN
  Administered 2021-03-16: 20 mL

## 2021-03-16 MED ORDER — MEPERIDINE HCL 25 MG/ML IJ SOLN: 6.2500 mg | INTRAMUSCULAR | Status: DC | PRN

## 2021-03-16 MED ORDER — FENTANYL CITRATE (PF) 100 MCG/2ML IJ SOLN
INTRAMUSCULAR | Status: DC | PRN
  Administered 2021-03-16 (×2): 50 ug via INTRAVENOUS

## 2021-03-16 SURGICAL SUPPLY — 46 items
ADH SKN CLS APL DERMABOND .7 (GAUZE/BANDAGES/DRESSINGS) ×1
APL PRP STRL LF DISP 70% ISPRP (MISCELLANEOUS) ×1
BAG SPEC RTRVL LRG 6X4 10 (ENDOMECHANICALS) ×1
BLADE SURG SZ11 CARB STEEL (BLADE) ×2 IMPLANT
CATH ROBINSON RED A/P 16FR (CATHETERS) ×2 IMPLANT
CHLORAPREP W/TINT 26 (MISCELLANEOUS) ×2 IMPLANT
CORD MONOPOLAR M/FML 12FT (MISCELLANEOUS) IMPLANT
DERMABOND ADVANCED (GAUZE/BANDAGES/DRESSINGS) ×1
DERMABOND ADVANCED .7 DNX12 (GAUZE/BANDAGES/DRESSINGS) ×1 IMPLANT
DRSG TELFA 4X3 1S NADH ST (GAUZE/BANDAGES/DRESSINGS) ×2 IMPLANT
GAUZE 4X4 16PLY ~~LOC~~+RFID DBL (SPONGE) ×4 IMPLANT
GLOVE SURG ENC MOIS LTX SZ6.5 (GLOVE) ×2 IMPLANT
GLOVE SURG ENC MOIS LTX SZ8 (GLOVE) IMPLANT
GLOVE SURG UNDER LTX SZ7 (GLOVE) ×2 IMPLANT
GOWN STRL REUS W/ TWL LRG LVL3 (GOWN DISPOSABLE) ×2 IMPLANT
GOWN STRL REUS W/TWL LRG LVL3 (GOWN DISPOSABLE) ×4
GOWN STRL REUS W/TWL XL LVL4 (GOWN DISPOSABLE) ×2 IMPLANT
GRASPER SUT TROCAR 14GX15 (MISCELLANEOUS) ×2 IMPLANT
IRRIGATION STRYKERFLOW (MISCELLANEOUS) IMPLANT
IRRIGATOR STRYKERFLOW (MISCELLANEOUS)
IV LACTATED RINGERS 1000ML (IV SOLUTION) IMPLANT
KIT PINK PAD W/HEAD ARE REST (MISCELLANEOUS) ×2
KIT PINK PAD W/HEAD ARM REST (MISCELLANEOUS) ×1 IMPLANT
KIT TURNOVER CYSTO (KITS) ×2 IMPLANT
MANIFOLD NEPTUNE II (INSTRUMENTS) ×2 IMPLANT
NS IRRIG 500ML POUR BTL (IV SOLUTION) ×2 IMPLANT
PACK GYN LAPAROSCOPIC (MISCELLANEOUS) ×2 IMPLANT
PAD OB MATERNITY 4.3X12.25 (PERSONAL CARE ITEMS) IMPLANT
PAD PREP 24X41 OB/GYN DISP (PERSONAL CARE ITEMS) ×2 IMPLANT
POUCH ENDO CATCH 10MM SPEC (MISCELLANEOUS) IMPLANT
POUCH SPECIMEN RETRIEVAL 10MM (ENDOMECHANICALS) ×2 IMPLANT
SCISSORS METZENBAUM CVD 33 (INSTRUMENTS) IMPLANT
SCRUB EXIDINE 4% CHG 4OZ (MISCELLANEOUS) ×2 IMPLANT
SET TUBE SMOKE EVAC HIGH FLOW (TUBING) ×2 IMPLANT
SHEARS HARMONIC ACE PLUS 36CM (ENDOMECHANICALS) ×2 IMPLANT
SLEEVE ENDOPATH XCEL 5M (ENDOMECHANICALS) ×2 IMPLANT
SUT VIC AB 0 CT1 36 (SUTURE) ×2 IMPLANT
SUT VIC AB 3-0 SH 27 (SUTURE)
SUT VIC AB 3-0 SH 27X BRD (SUTURE) IMPLANT
SUT VIC AB 4-0 SH 27 (SUTURE) ×2
SUT VIC AB 4-0 SH 27XANBCTRL (SUTURE) ×1 IMPLANT
SUT VICRYL 0 AB UR-6 (SUTURE) ×2 IMPLANT
TROCAR ENDO BLADELESS 11MM (ENDOMECHANICALS) ×2 IMPLANT
TROCAR XCEL NON-BLD 5MMX100MML (ENDOMECHANICALS) ×2 IMPLANT
TROCAR XCEL UNIV SLVE 11M 100M (ENDOMECHANICALS) IMPLANT
WATER STERILE IRR 500ML POUR (IV SOLUTION) IMPLANT

## 2021-03-16 NOTE — Interval H&P Note (Signed)
History and Physical Interval Note:  03/16/2021 11:32 AM  Brittany Richards  has presented today for surgery, with the diagnosis of right hydrosalpinx, hx of tubo-ovarian abscesses.  The various methods of treatment have been discussed with the patient and family. After consideration of risks, benefits and other options for treatment, the patient has consented to  Procedure(s): LAPAROSCOPIC SALPINGECTOMY (RIGHT) (Right) as a surgical intervention.  The patient's history has been reviewed, patient examined, no change in status, stable for surgery.  I have reviewed the patient's chart and labs.  Questions were answered to the patient's satisfaction.     Rubie Maid, MD [facility]

## 2021-03-16 NOTE — Anesthesia Preprocedure Evaluation (Signed)
Anesthesia Evaluation  Patient identified by MRN, date of birth, ID band Patient awake    Reviewed: Allergy & Precautions, NPO status , Patient's Chart, lab work & pertinent test results  History of Anesthesia Complications Negative for: history of anesthetic complications  Airway Mallampati: II  TM Distance: >3 FB Neck ROM: Full    Dental no notable dental hx.    Pulmonary neg pulmonary ROS, neg sleep apnea, neg COPD,    breath sounds clear to auscultation- rhonchi (-) wheezing      Cardiovascular Exercise Tolerance: Good (-) hypertension(-) CAD and (-) Past MI  Rhythm:Regular Rate:Normal - Systolic murmurs and - Diastolic murmurs    Neuro/Psych negative neurological ROS  negative psych ROS   GI/Hepatic negative GI ROS, Neg liver ROS,   Endo/Other  negative endocrine ROSneg diabetes  Renal/GU negative Renal ROS     Musculoskeletal negative musculoskeletal ROS (+)   Abdominal (+) - obese,   Peds  Hematology negative hematology ROS (+)   Anesthesia Other Findings   Reproductive/Obstetrics                             Anesthesia Physical Anesthesia Plan  ASA: 1  Anesthesia Plan: General   Post-op Pain Management:    Induction: Intravenous  PONV Risk Score and Plan: 2 and Ondansetron, Dexamethasone and Midazolam  Airway Management Planned: Oral ETT  Additional Equipment:   Intra-op Plan:   Post-operative Plan: Extubation in OR  Informed Consent: I have reviewed the patients History and Physical, chart, labs and discussed the procedure including the risks, benefits and alternatives for the proposed anesthesia with the patient or authorized representative who has indicated his/her understanding and acceptance.     Dental advisory given  Plan Discussed with: CRNA and Anesthesiologist  Anesthesia Plan Comments:         Anesthesia Quick Evaluation

## 2021-03-16 NOTE — Anesthesia Procedure Notes (Signed)
Procedure Name: Intubation Date/Time: 03/16/2021 11:57 AM Performed by: Fredderick Phenix, CRNA Pre-anesthesia Checklist: Patient identified, Emergency Drugs available, Suction available and Patient being monitored Patient Re-evaluated:Patient Re-evaluated prior to induction Oxygen Delivery Method: Circle system utilized Preoxygenation: Pre-oxygenation with 100% oxygen Induction Type: IV induction Ventilation: Mask ventilation without difficulty Laryngoscope Size: Mac and 4 Grade View: Grade I Tube type: Oral Tube size: 7.0 mm Number of attempts: 1 Airway Equipment and Method: Stylet and Oral airway Placement Confirmation: ETT inserted through vocal cords under direct vision, positive ETCO2 and breath sounds checked- equal and bilateral Secured at: 21 cm Tube secured with: Tape Dental Injury: Teeth and Oropharynx as per pre-operative assessment

## 2021-03-16 NOTE — Op Note (Addendum)
Procedure(s): LAPAROSCOPIC SALPINGECTOMY (RIGHT) Procedure Note  Brittany Richards female 40 y.o. 03/16/2021  Indications: The patient is a 40 y.o. [phone] female with history of recurrent right tubo-ovarian abscesses, right hydrosalpinx, right ovarian cyst  Pre-operative Diagnosis: History of multiple right tubo-ovarian abscesses, right hydrosalpinx, right ovarian cyst  Post-operative Diagnosis: Same, except no evidence of right ovarian cyst  Surgeon: Rubie Maid, MD  Assistants:  Surgical scrub tech.   Anesthesia: General endotracheal anesthesia  Findings: The uterus was sounded to 10 cm.  Right fallopian tube appeared to be dilated. Filmy adhesions of the tube to the right ovarian fossa.   Normal appearing left fallopian tube and bilateral ovaries appeared normal.  Moderate filmy adhesions of the posterior cul-de-sac.  Peritoneal window also identified.  Several small white fibrotic lesions present along the right abdominal sidewall. Concern for endometriosis implants.   Procedure Details: The patient was seen in the Holding Room. The risks, benefits, complications, treatment options, and expected outcomes were discussed with the patient.  The patient concurred with the proposed plan, giving informed consent.  The site of surgery properly noted/marked. The patient was taken to the Operating Room, identified as Brittany Richards and the procedure verified as Procedure(s) (LRB): LAPAROSCOPIC SALPINGECTOMY (RIGHT) (Right). A Time Out was held and the above information confirmed.  She was then placed under general anesthesia without difficulty. She was placed in the dorsal lithotomy position, and was prepped and draped in a sterile manner.  A straight catheterization was performed. A sterile speculum was inserted into the vagina and the cervix was grasped at the anterior lip using a single-toothed tenaculum.  The uterus was sounded to 8.5 cm, and a Hulka clamp was placed for uterine  manipulation.  The speculum and tenaculum were then removed. After an adequate timeout was performed, attention was turned to the abdomen where an umbilical incision was made with the scalpel.  The Optiview 5-mm trocar and sleeve were then advanced without difficulty with the laparoscope under direct visualization into the abdomen.  The abdomen was then insufflated with carbon dioxide gas and adequate pneumoperitoneum was obtained. A 5-mm left lower quadrant port and an 5-mm right lower quadrant port were then placed under direct visualization.  A survey of the patient's pelvis and abdomen revealed the findings as above.  A laparoscopic punch biopsy was utilized at an area of white fibrotic implants on the right anterior abdominal wall.  A second biopsy was performed in the posterior cul-de-sac as a peritoneal window was observed.  On the right side, lysis of the filmy adhesions of the right fallopian tube to the ovarian fossa was performed.  The fallopian tube was clamped and transected along the mesosalpinx using the Harmonic device until completely separated. Due to the diameter of the tube, the decision was made to convert the right 5-mm port site to an 11-mm port site.  An Endocatch bag was then inserted into the 11 mm trochar and the right tube was removed.  The 11-mm trochar was then removed.  A cone was inserted and the PMI device was used to close the fascia of the 11-mm port site.  A final survey was performed, where good hemostasis was noted on the right side. All biopsy sites were also noted to be hemostatic. All trocars were removed under direct visualization, and the abdomen which was desufflated.    All skin incisions were closed with 4-0 Vicryl subcuticular stitches.  Dermabond was placed over the incisions.  A total of 15 ml of 0.5%  Marcaine was used for local anesthetic. The Hulka clamp was removed from the vagina. The patient tolerated the procedures well.  All instruments, needles, and sponge  counts were correct x 2. The patient was taken to the recovery room awake, extubated and in stable condition.   Estimated Blood Loss:  20 ml      Drains: straight catheterization prior to procedure with  400 ml of clear urine         Total IV Fluids:  800 ml  Specimens: Anterior abdominal wall peritoneal biopsy; posterior cul-de-sac biopsy; right fallopian tube         Implants: None         Complications:  None; patient tolerated the procedure well.         Disposition: PACU - hemodynamically stable.         Condition: stable   Rubie Maid, MD [facility]

## 2021-03-16 NOTE — Anesthesia Postprocedure Evaluation (Signed)
Anesthesia Post Note  Patient: Herberta Pickron  Procedure(s) Performed: LAPAROSCOPIC SALPINGECTOMY (RIGHT) (Right)  Patient location during evaluation: PACU Anesthesia Type: General Level of consciousness: awake and alert and oriented Pain management: pain level controlled Vital Signs Assessment: post-procedure vital signs reviewed and stable Respiratory status: spontaneous breathing, nonlabored ventilation and respiratory function stable Cardiovascular status: blood pressure returned to baseline and stable Postop Assessment: no signs of nausea or vomiting Anesthetic complications: no   No notable events documented.   Last Vitals:  Vitals:   03/16/21 1415 03/16/21 1432  BP: (!) 145/92 (!) 144/85  Pulse: 71 74  Resp: 17 16  Temp:  (!) 36 C  SpO2: 100% 100%    Last Pain:  Vitals:   03/16/21 1432  TempSrc: Temporal  PainSc:                  Myla Mauriello

## 2021-03-16 NOTE — Transfer of Care (Signed)
Immediate Anesthesia Transfer of Care Note  Patient: Kanya Potteiger  Procedure(s) Performed: LAPAROSCOPIC SALPINGECTOMY (RIGHT) (Right)  Patient Location: PACU  Anesthesia Type:General  Level of Consciousness: awake and alert   Airway & Oxygen Therapy: Patient Spontanous Breathing and Patient connected to face mask oxygen  Post-op Assessment: Report given to RN and Post -op Vital signs reviewed and stable  Post vital signs: Reviewed and stable  Last Vitals:  Vitals Value Taken Time  BP 118/72 03/16/21 1325  Temp    Pulse 75 03/16/21 1327  Resp 17 03/16/21 1327  SpO2 100 % 03/16/21 1327  Vitals shown include unvalidated device data.  Last Pain:  Vitals:   03/16/21 1004  TempSrc: Temporal  PainSc: 2          Complications: No notable events documented.

## 2021-03-16 NOTE — Discharge Instructions (Addendum)
CIRUGIA AMBULATORIA       Instruccionnes de alta    Date (Fecha)    1.  Las drogas que se Statistician en su cuerpo [employer], asi            que por las proximas 24 horas usted no debe:   Conducir Scientist, research (medical)) un automovil   Hacer ninguna decision legal   Tomar ninguna bebida alcoholica  2.  A) Manana puede comenzar una dieta regular.  Es mejor que hoy empiece con           liquidos y gradualmente anada comidas solidas.       B) Puede comer cualquier comida que desee pero es mejor empezar con liquidos,                      luego sopitas con galletas saladas y gradualmente llegar a las comidas solidas.  3.  Por favor avise a su medico inmediatamente si usted tiene algun sangrado anormal,       tiene dificultad con la respiracion, enrojecimiento y Social research officer, government en el sitio de la cirugia, [place],       fiebro o dolor que se alivia con [place].  4.  A) Su visita posoperatoria (despues de su operacion) es con el  Dr.   Date                   Time        B)  Por favor llame para hacer la cita posoperatoria.  5.  Istrucciones especificas :   AMBULATORY SURGERY  DISCHARGE INSTRUCTIONS   The drugs that you were given will stay in your system until tomorrow so for the next 24 hours you should not:  Drive an automobile Make any legal decisions Drink any alcoholic beverage   You may resume regular meals tomorrow.  Today it is better to start with liquids and gradually work up to solid foods.  You may eat anything you prefer, but it is better to start with liquids, then soup and crackers, and gradually work up to solid foods.   Please notify your doctor immediately if you have any unusual bleeding, trouble breathing, redness and pain at the surgery site, drainage, fever, or pain not relieved by medication.    Additional Instructions:   Please contact your physician with any problems or Same Day Surgery at [phone], Monday through Friday 6 am to 4 pm, or   at [facility] number at [phone].

## 2021-03-16 NOTE — Progress Notes (Signed)
  [place], [place]  #[id number] used for interpretation today.

## 2021-03-17 ENCOUNTER — Encounter: Payer: Self-pay | Admitting: Obstetrics and Gynecology

## 2021-03-17 LAB — SURGICAL PATHOLOGY

## 2021-03-30 ENCOUNTER — Ambulatory Visit (INDEPENDENT_AMBULATORY_CARE_PROVIDER_SITE_OTHER): Payer: Self-pay | Admitting: Obstetrics and Gynecology

## 2021-03-30 ENCOUNTER — Other Ambulatory Visit: Payer: Self-pay

## 2021-03-30 ENCOUNTER — Encounter: Payer: Self-pay | Admitting: Obstetrics and Gynecology

## 2021-03-30 VITALS — BP 138/79 | HR 89 | Ht 62.0 in | Wt 148.3 lb

## 2021-03-30 DIAGNOSIS — Z4889 Encounter for other specified surgical aftercare: Secondary | ICD-10-CM

## 2021-03-30 DIAGNOSIS — Z9079 Acquired absence of other genital organ(s): Secondary | ICD-10-CM

## 2021-03-30 NOTE — Progress Notes (Signed)
    OBSTETRICS/GYNECOLOGY POST-OPERATIVE CLINIC VISIT  Subjective:     Brittany Richards is a 40 y.o. female who presents to the clinic 2 weeks status post laparoscopy salpingectomy right for adnexal mass history of recurrent right tubo-ovarian abscesses). Also had peritoneal biopsies performed for concerns for endometriosis noted intraoperatively. Eating a regular diet without difficulty. Bowel movements are normal. Pain is controlled without any medications.  The following portions of the patient's history were reviewed and updated as appropriate: allergies, current medications, past family history, past medical history, past social history, past surgical history, and problem list.  Review of Systems Pertinent items noted in HPI and remainder of comprehensive ROS otherwise negative.   Objective:   BP 138/79 (BP Location: Left Arm, Patient Position: Sitting, Cuff Size: Normal)   Pulse 89   Ht 5\' 2"  (1.575 m)   Wt 148 lb 4.8 oz (67.3 kg)   LMP 03/17/2021   BMI 27.12 kg/m  Body mass index is 27.12 kg/m.  General:  alert and no distress  Abdomen: soft, bowel sounds active, non-tender  Incision:   healing well, no drainage, no erythema, no hernia, no seroma, no swelling, no dehiscence, incision well approximated    Pathology:  . SOFT TISSUE, ANTERIOR ABDOMINAL WALL; BIOPSY:  - BENIGN MESOTHELIAL LINED FIBROMEMBRANOUS TISSUE WITH MILD CHRONIC  INFLAMMATION.  - NO ENDOMETRIAL GLANDS OR STROMA IDENTIFIED.  - NO EVIDENCE OF MALIGNANCY.   B. SOFT TISSUE, POSTERIOR CUL-DE-SAC; BIOPSY:  - BENIGN MESOTHELIAL LINED FIBROMEMBRANOUS TISSUE WITH MILD CHRONIC  INFLAMMATION.  - NO ENDOMETRIAL GLANDS OR STROMA IDENTIFIED.  - NO EVIDENCE OF MALIGNANCY.   C. FALLOPIAN TUBE, RIGHT; SALPINGECTOMY:  - DILATED FALLOPIAN TUBE, COMPATIBLE WITH HYDROSALPINX.  - FIBROUS AND FIBRINOUS SEROSAL ADHESIONS.  - NEGATIVE FOR ATYPIA AND MALIGNANCY.   Assessment:   Patient s/p right salpingectomy and  peritoneal biopsies Doing well postoperatively.   Plan:   1. Continue any current medications as instructed by provider. 2. Wound care discussed. 3. Operative findings again reviewed. Pathology report discussed. 4. Activity restrictions: none 5. Anticipated return to work: not applicable. 6. Follow up:  as needed    Rubie Maid, MD [facility]

## 2021-04-20 ENCOUNTER — Ambulatory Visit: Payer: Self-pay | Admitting: Surgery

## 2021-04-27 ENCOUNTER — Ambulatory Visit (INDEPENDENT_AMBULATORY_CARE_PROVIDER_SITE_OTHER): Payer: Self-pay | Admitting: Surgery

## 2021-04-27 ENCOUNTER — Telehealth: Payer: Self-pay | Admitting: Surgery

## 2021-04-27 ENCOUNTER — Encounter: Payer: Self-pay | Admitting: Surgery

## 2021-04-27 ENCOUNTER — Other Ambulatory Visit: Payer: Self-pay

## 2021-04-27 VITALS — BP 139/83 | HR 96 | Temp 98.6°F | Ht 62.0 in | Wt 150.4 lb

## 2021-04-27 DIAGNOSIS — K8020 Calculus of gallbladder without cholecystitis without obstruction: Secondary | ICD-10-CM

## 2021-04-27 NOTE — Telephone Encounter (Signed)
Outgoing call is made again, spoke with husband.  They both are now informed of all dates regarding her surgery and verbalized understanding.

## 2021-04-27 NOTE — Progress Notes (Signed)
Outpatient Surgical Follow Up  04/27/2021  Brittany Richards is an 40 y.o. female.   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  This was over a year ago.  She has recovered quite well.  No issues on her left side.  She did have recent CT scan of the abdomen and pelvis due to right abdominal pain.  I have personally reviewed there is evidence of cholelithiasis.  There is no evidence of recurrence from the desmoid tumor. He does endorses intermittent right upper quadrant pain for several months.  She states that it is usually worse but half an hour after eating a heavy meals.  She did come to the emergency room a few months ago.  She did have also an ultrasound that have personally reviewed showing evidence of cholelithiasis.  Normal common bile duct normal LFTs.  She also had a recent laparoscopic salpingectomy 6 weeks ago and is recovering well  Past Medical History:  Diagnosis Date   Anemia    Hydrosalpinx     Past Surgical History:  Procedure Laterality Date   EXCISION OF ABDOMINAL WALL TUMOR N/A 11/13/2019   Procedure: EXCISION OF ABDOMINAL WALL TUMOR;  Surgeon: Jules Husbands, MD;  Location: [facility] ORS;  Service: General;  Laterality: N/A;   LAPAROSCOPIC BILATERAL SALPINGECTOMY Right 03/16/2021   Procedure: LAPAROSCOPIC SALPINGECTOMY (RIGHT);  Surgeon: Rubie Maid, MD;  Location: [facility] ORS;  Service: Gynecology;  Laterality: Right;    Family History  Problem Relation Age of Onset   Heart attack Mother    Hypertension Mother    Healthy Father    Breast cancer Neg Hx    Ovarian cancer Neg Hx     Social History:  reports that she has never smoked. She has never used smokeless tobacco. She reports that she does not drink alcohol and does not use drugs.  Allergies: No Known Allergies  Medications  reviewed.    ROS Full ROS performed and is otherwise negative other than what is stated in HPI   BP 139/83   Pulse 96   Temp 98.6 F (37 C) (Oral)   Ht 5\' 2"  (1.575 m)   Wt 150 lb 6.4 oz (68.2 kg)   SpO2 99%   BMI 27.51 kg/m   Physical Exam Vitals and nursing note reviewed. Exam conducted with a chaperone present.  Constitutional:      General: She is not in acute distress.    Appearance: Normal appearance. She is not ill-appearing.  Cardiovascular:     Rate and Rhythm: Normal rate and regular rhythm.     Heart sounds: No murmur heard. Pulmonary:     Effort: Pulmonary effort is normal. No respiratory distress.     Breath sounds: Normal breath sounds. No stridor. No wheezing or rhonchi.  Abdominal:     General: Abdomen is flat. There is no distension.     Palpations: Abdomen is soft. There is no mass.     Tenderness: There is no abdominal tenderness. There is no guarding or rebound.     Hernia: No hernia is present.  Musculoskeletal:        General: No swelling or tenderness. Normal range of motion.     Cervical back: Normal range of motion and neck supple. No rigidity or tenderness.  Skin:    General:  Skin is warm and dry.     Capillary Refill: Capillary refill takes less than 2 seconds.     Coloration: Skin is not jaundiced or pale.  Neurological:     General: No focal deficit present.     Mental Status: She is alert and oriented to person, place, and time.  Psychiatric:        Mood and Affect: Mood normal.        Behavior: Behavior normal.        Thought Content: Thought content normal.        Judgment: Judgment normal.      Assessment/Plan:  1. Calculus of gallbladder without cholecystitis without obstruction He does have classic symptoms consistent with biliary colic.  I do recommend cholecystectomy. I discussed the procedure in detail.  The patient was given Neurosurgeon.  We discussed the risks and benefits of a laparoscopic cholecystectomy and  possible cholangiogram including, but not limited to bleeding, infection, injury to surrounding structures such as the intestine or liver, bile leak, retained gallstones, need to convert to an open procedure, prolonged diarrhea, blood clots such as  DVT, common bile duct injury, anesthesia risks, and possible need for additional procedures.  The likelihood of improvement in symptoms and return to the patient's normal status is good. We discussed the typical post-operative recovery course.    Greater than 50% of the 45 minutes  visit was spent in counseling/coordination of care   Caroleen Hamman, MD [place] Surgeon

## 2021-04-27 NOTE — H&P (View-Only) (Signed)
Outpatient Surgical Follow Up  04/27/2021  Brittany Richards is an 40 y.o. female.   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  This was over a year ago.  She has recovered quite well.  No issues on her left side.  She did have recent CT scan of the abdomen and pelvis due to right abdominal pain.  I have personally reviewed there is evidence of cholelithiasis.  There is no evidence of recurrence from the desmoid tumor. He does endorses intermittent right upper quadrant pain for several months.  She states that it is usually worse but half an hour after eating a heavy meals.  She did come to the emergency room a few months ago.  She did have also an ultrasound that have personally reviewed showing evidence of cholelithiasis.  Normal common bile duct normal LFTs.  She also had a recent laparoscopic salpingectomy 6 weeks ago and is recovering well  Past Medical History:  Diagnosis Date   Anemia    Hydrosalpinx     Past Surgical History:  Procedure Laterality Date   EXCISION OF ABDOMINAL WALL TUMOR N/A 11/13/2019   Procedure: EXCISION OF ABDOMINAL WALL TUMOR;  Surgeon: Jules Husbands, MD;  Location: [facility] ORS;  Service: General;  Laterality: N/A;   LAPAROSCOPIC BILATERAL SALPINGECTOMY Right 03/16/2021   Procedure: LAPAROSCOPIC SALPINGECTOMY (RIGHT);  Surgeon: Rubie Maid, MD;  Location: [facility] ORS;  Service: Gynecology;  Laterality: Right;    Family History  Problem Relation Age of Onset   Heart attack Mother    Hypertension Mother    Healthy Father    Breast cancer Neg Hx    Ovarian cancer Neg Hx     Social History:  reports that she has never smoked. She has never used smokeless tobacco. She reports that she does not drink alcohol and does not use drugs.  Allergies: No Known Allergies  Medications  reviewed.    ROS Full ROS performed and is otherwise negative other than what is stated in HPI   BP 139/83    Pulse 96    Temp 98.6 F (37 C) (Oral)    Ht 5\' 2"  (1.575 m)    Wt 150 lb 6.4 oz (68.2 kg)    SpO2 99%    BMI 27.51 kg/m   Physical Exam Vitals and nursing note reviewed. Exam conducted with a chaperone present.  Constitutional:      General: She is not in acute distress.    Appearance: Normal appearance. She is not ill-appearing.  Cardiovascular:     Rate and Rhythm: Normal rate and regular rhythm.     Heart sounds: No murmur heard. Pulmonary:     Effort: Pulmonary effort is normal. No respiratory distress.     Breath sounds: Normal breath sounds. No stridor. No wheezing or rhonchi.  Abdominal:     General: Abdomen is flat. There is no distension.     Palpations: Abdomen is soft. There is no mass.     Tenderness: There is no abdominal tenderness. There is no guarding or rebound.     Hernia: No hernia is present.  Musculoskeletal:        General: No swelling or tenderness. Normal range of motion.     Cervical back: Normal range of motion and neck supple. No rigidity or tenderness.  Skin:    General: Skin is warm and dry.     Capillary Refill: Capillary refill takes less than 2 seconds.     Coloration: Skin is not jaundiced or pale.  Neurological:     General: No focal deficit present.     Mental Status: She is alert and oriented to person, place, and time.  Psychiatric:        Mood and Affect: Mood normal.        Behavior: Behavior normal.        Thought Content: Thought content normal.        Judgment: Judgment normal.      Assessment/Plan:  1. Calculus of gallbladder without cholecystitis without obstruction He does have classic symptoms consistent with biliary colic.  I do recommend cholecystectomy. I discussed the procedure in detail.  The patient was given Neurosurgeon.  We discussed the risks and benefits of a laparoscopic cholecystectomy and  possible cholangiogram including, but not limited to bleeding, infection, injury to surrounding structures such as the intestine or liver, bile leak, retained gallstones, need to convert to an open procedure, prolonged diarrhea, blood clots such as  DVT, common bile duct injury, anesthesia risks, and possible need for additional procedures.  The likelihood of improvement in symptoms and return to the patient's normal status is good. We discussed the typical post-operative recovery course.    Greater than 50% of the 45 minutes  visit was spent in counseling/coordination of care   Caroleen Hamman, MD [place] Surgeon

## 2021-04-27 NOTE — Patient Instructions (Addendum)
Our surgery scheduler Pamala Hurry will call you within 24-48 hours to get you scheduled. If you have not heard from her after 48 hours, please call our office. You will not need to get Covid tested before surgery and have the blue sheet available when she calls to write down important information.    If you have any concerns or questions, please feel free to call our office.    Colecistectoma mnimamente invasiva Minimally Invasive Cholecystectomy  Una colecistectoma mnimamente invasiva es una ciruga que se realiza para extirpar la vescula biliar. La vescula biliar es un rgano que tiene forma de pera y se encuentra debajo del hgado, del lado derecho del cuerpo. La vescula biliar almacena bilis, un lquido que ayuda al organismo a digerir las grasas. La colecistectoma se realiza con frecuencia para tratar la inflamacin (irritacin e hinchazn) de la vescula biliar (colecistitis). Por lo general, esta afeccin se debe a una acumulacin de clculos biliares (colelitiasis) en la vescula biliar o al estancamiento del lquido de la vescula biliar a causa de que los clculos biliares se atascan en los conductos (tubos) y obstruyen el paso de la bilis. Esto puede producir inflamacin y Social research officer, government. En los [employer], podr ser [place]. Este procedimiento se realiza a travs de pequeas incisiones en el abdomen, en lugar de una incisin grande. Tambin se denomina "ciruga laparoscpica". Se introduce un endoscopio delgado que tiene Public relations account executive (laparoscopio) a travs de una incisin. A travs de las otras incisiones, se introducen los instrumentos quirrgicos. En algunos casos, es posible que [place] mnimamente invasiva deba cambiarse a una [place] realizada a travs de una incisin ms grande. Esta se denomina "ciruga abierta". Informe al mdico acerca de lo siguiente: Cualquier alergia que tenga. Todos los [employer] [place], incluidos vitaminas, hierbas, gotas  oftlmicas, cremas y medicamentos de venta libre. Problemas previos que usted o algn miembro de su familia hayan tenido con los anestsicos. Cualquier problema de la sangre que tenga. Cirugas a las que se haya sometido. Cualquier afeccin mdica que tenga. Si est embarazada o podra estarlo. Cules son los riesgos? En general, se trata de un procedimiento seguro. Sin embargo, pueden ocurrir complicaciones, por ejemplo: Infeccin. Sangrado. Reacciones alrgicas a los medicamentos. Daos a las estructuras o los rganos cercanos. Un clculo biliar que queda en el conducto biliar comn. El conducto coldoco transporta la bilis desde la vescula biliar hasta el intestino delgado. Una filtracin de bilis del hgado o del conducto qustico despus de que se extirpa la vescula biliar. Qu ocurre antes del procedimiento? Cundo dejar de comer y beber Siga las instrucciones del mdico con respecto a lo que puede comer y beber antes del procedimiento. Pueden incluir: Ocho horas antes del procedimiento Deje de comer la mayora de los alimentos. No coma carne, alimentos fritos ni alimentos grasos. Consuma solo alimentos livianos, como tostadas o Soil scientist. Todos los lquidos son aceptables, excepto las bebidas energticas y el alcohol. Seis horas antes del procedimiento Deje de comer. Beba nicamente lquidos transparentes, como agua, jugo de fruta transparente, caf solo, t solo y bebidas deportivas. No consuma bebidas energticas ni alcohol. Dos horas antes del procedimiento Deje de beber todos los lquidos. Es posible que le permitan tomar medicamentos con pequeos sorbos de [place]. Si no sigue las instrucciones del mdico, el procedimiento puede retrasarse o cancelarse. Medicamentos Consulte al mdico si debe hacer o no lo siguiente: Quarry manager o suspender los medicamentos que [place] habitualmente. Esto es muy importante si toma medicamentos para  la diabetes o anticoagulantes. Tomar  medicamentos como aspirina e ibuprofeno. Estos medicamentos pueden tener un efecto anticoagulante en la [place]. No tome estos medicamentos a menos que el mdico se lo indique. Usar medicamentos de venta libre, vitaminas, hierbas y suplementos. Instrucciones generales Si va a marcharse a su casa inmediatamente despus del procedimiento, pdale a un adulto responsable que: Lo lleve a su casa desde el hospital o la clnica. No se le permitir conducir. Lo cuide durante el [employer] indiquen. No consuma ningn producto que contenga nicotina ni tabaco durante al [employer] las 4 semanas anteriores al procedimiento. Estos productos incluyen cigarrillos, tabaco para Higher education careers adviser y aparatos de vapeo, como los Psychologist, sport and exercise. Si necesita ayuda para dejar de fumar, consulte al [employer]. Pregntele al mdico: Cmo se Tax inspector de la Leisure centre manager. Qu medidas se tomarn para evitar una infeccin. Pueden incluir: Rasurar el vello del lugar de la ciruga. Lavar la piel con un jabn antisptico. Recibir antibiticos. Qu ocurre durante el procedimiento?  Le colocarn una va intravenosa (i.v.) en una vena. Le administrarn uno de los siguientes medicamentos o ambos: Un medicamento para ayudar a Nurse, children's (sedante). Un medicamento que lo har dormir (anestesia general). Su cirujano le har varias incisiones pequeas en el abdomen. El laparoscopio se introducir a travs de una de las pequeas incisiones. La cmara del laparoscopio enviar imgenes a un monitor que se encuentra en el quirfano. Esto permitir a su Adult nurse del abdomen. Le inyectarn un gas en el abdomen. Esto expandir el abdomen para que el cirujano tenga ms lugar para Chief of Staff. El resto del instrumental necesario para el procedimiento se introducir a travs de las otras incisiones. Se extirpar la vescula biliar a travs de una de las incisiones. Se puede examinar el conducto coldoco. Si se encuentran clculos en  la va biliar, tal vez deban extirparse. Despus de la extirpacin de la vescula biliar, se cerrarn las incisiones con puntos (suturas), grapas o goma para cerrar la piel. Las incisiones pueden cubrirse con una venda (vendaje). El procedimiento puede variar segn el mdico y el hospital. Sander Nephew ocurre despus del procedimiento? Le controlarn la presin arterial, la frecuencia cardaca, la frecuencia respiratoria y Retail buyer de oxgeno en la sangre hasta que le den el alta del hospital o la clnica. Le darn analgsicos para Financial controller, si es necesario. Es posible que le coloquen un drenaje en la incisin. Se lo retirarn [employer] despus del procedimiento. Resumen La colecistectoma mnimamente invasiva, tambin llamada colecistectoma laparoscpica, es una ciruga que se realiza para extirpar la vescula biliar a travs de pequeas incisiones. Informe a su mdico sobre todas las otras afecciones que tenga y [place] todos los medicamentos que est usando para dichas afecciones. Antes del procedimiento, siga las instrucciones sobre cundo dejar de comer y beber y Christene Slates cambiar o suspender medicamentos. Haga que un adulto responsable lo cuide durante el tiempo que le indiquen despus de que le den el alta del hospital o de la clnica. Esta informacin no tiene Marine scientist el consejo del mdico. Asegrese de hacerle al mdico cualquier pregunta que tenga. Document Revised: 11/26/2020 Document Reviewed: 11/26/2020 Elsevier Patient Education  [place].

## 2021-04-27 NOTE — Telephone Encounter (Signed)
Outgoing call is made, left message for patient to call.  Please inform of the following:   Pre-Admission date/time, COVID Testing date and Surgery date.  Surgery Date: 05/08/21 Preadmission Testing Date: 12/ 09/22(phone 1p-5p) Covid Testing Date: Not needed.    Also patient will need to call at [phone], between 1-3:00pm the day before surgery, to find out what time to arrive for surgery.

## 2021-05-01 ENCOUNTER — Encounter
Admission: RE | Admit: 2021-05-01 | Discharge: 2021-05-01 | Disposition: A | Discharge status: Home / Self Care | Payer: Self-pay | Source: Ambulatory Visit | Attending: Surgery | Admitting: Surgery

## 2021-05-01 ENCOUNTER — Other Ambulatory Visit: Payer: Self-pay

## 2021-05-01 DIAGNOSIS — K8020 Calculus of gallbladder without cholecystitis without obstruction: Secondary | ICD-10-CM

## 2021-05-01 NOTE — Pre-Procedure Instructions (Signed)
Pre admit testing interview completed over the phone. Patient instructions discussed as well with the help of interpreter ( ID number  [id number]). Patient verbalized understanding.

## 2021-05-01 NOTE — Patient Instructions (Addendum)
Your procedure is scheduled on: 05/08/2021  Report to the Registration Desk on the 1st floor of the [facility].  To find out your arrival time, please call [phone] between 1PM - 3PM on: 12/15/ 2022   REMEMBER: Instructions that are not followed completely may result in serious medical risk, up to and including death; or upon the discretion of your surgeon and anesthesiologist your surgery may need to be rescheduled.  Do not eat food after midnight the night before surgery.  No gum chewing, lozengers or hard candies.  You may however, drink CLEAR liquids up to 2 hours before you are scheduled to arrive for your surgery. Do not drink anything within 2 hours of your scheduled arrival time.  Clear liquids include: - water  - apple juice without pulp - gatorade (not RED, PURPLE, OR BLUE) - black coffee or tea (Do NOT add milk or creamers to the coffee or tea) Do NOT drink anything that is not on this list.   TAKE THESE MEDICATIONS THE MORNING OF SURGERY WITH A SIP OF WATER: Tylenol as needed.    One week prior to surgery: Stop Anti-inflammatories (NSAIDS) such as Advil, Aleve, Ibuprofen, Motrin, Naproxen, Naprosyn and Aspirin based products such as Excedrin, Goodys Powder, BC Powder. Stop ANY OVER THE COUNTER supplements until after surgery like omega 3 and ferrus sulfate You may however, continue to take Tylenol if needed for pain up until the day of surgery.  No Alcohol for 24 hours before or after surgery.  No Smoking including e-cigarettes for 24 hours prior to surgery.  No chewable tobacco products for at least 6 hours prior to surgery.  No nicotine patches on the day of surgery.  Do not use any "recreational" drugs for at least a week prior to your surgery.  Please be advised that the combination of cocaine and anesthesia may have negative outcomes, up to and including death. If you test positive for cocaine, your surgery will be cancelled.  On the morning of surgery  brush your teeth with toothpaste and water, you may rinse your mouth with mouthwash if you wish. Do not swallow any toothpaste or mouthwash.  Use CHG Soap or wipes as directed on instruction sheet.  Do not wear jewelry, make-up, hairpins, clips or nail polish.  Do not wear lotions, powders, or perfumes.   Do not shave body from the neck down 48 hours prior to surgery just in case you cut yourself which could leave a site for infection.  Also, freshly shaved skin may become irritated if using the CHG soap.  Contact lenses, hearing aids and dentures may not be worn into surgery.  Do not bring valuables to the hospital. [facility] is not responsible for any missing/lost belongings or valuables.    Notify your doctor if there is any change in your medical condition (cold, fever, infection).  Wear comfortable clothing (specific to your surgery type) to the hospital.  After surgery, you can help prevent lung complications by doing breathing exercises.  Take deep breaths and cough every 1-2 hours. Your doctor may order a device called an Incentive Spirometer to help you take deep breaths. When coughing or sneezing, hold a pillow firmly against your incision with both hands. This is called "splinting." Doing this helps protect your incision. It also decreases belly discomfort.  If you are being admitted to the hospital overnight, leave your suitcase in the car. After surgery it may be brought to your room.  If you are being  discharged the day of surgery, you will not be allowed to drive home. You will need a responsible adult (18 years or older) to drive you home and stay with you that night.   If you are taking public transportation, you will need to have a responsible adult (18 years or older) with you. Please confirm with your physician that it is acceptable to use public transportation.   Please call the [facility] at [phone] if you have any questions about  these instructions.  Surgery Visitation Policy:  Patients undergoing a surgery or procedure may have one family member or support person with them as long as that person is not COVID-19 positive or experiencing its symptoms.  That person may remain in the waiting area during the procedure and may rotate out with other people.

## 2021-05-08 ENCOUNTER — Other Ambulatory Visit: Payer: Self-pay

## 2021-05-08 ENCOUNTER — Ambulatory Visit: Payer: Self-pay | Admitting: Anesthesiology

## 2021-05-08 ENCOUNTER — Encounter
Admission: RE | Disposition: A | Discharge status: Home / Self Care | Payer: Self-pay | Source: Ambulatory Visit | Attending: Surgery

## 2021-05-08 ENCOUNTER — Ambulatory Visit
Admission: RE | Admit: 2021-05-08 | Discharge: 2021-05-08 | Disposition: A | Discharge status: Home / Self Care | Payer: Self-pay | Source: Ambulatory Visit | Attending: Surgery | Admitting: Surgery

## 2021-05-08 ENCOUNTER — Encounter: Payer: Self-pay | Admitting: Surgery

## 2021-05-08 DIAGNOSIS — K8064 Calculus of gallbladder and bile duct with chronic cholecystitis without obstruction: Secondary | ICD-10-CM | POA: Insufficient documentation

## 2021-05-08 DIAGNOSIS — K8050 Calculus of bile duct without cholangitis or cholecystitis without obstruction: Secondary | ICD-10-CM

## 2021-05-08 DIAGNOSIS — K8020 Calculus of gallbladder without cholecystitis without obstruction: Secondary | ICD-10-CM

## 2021-05-08 HISTORY — PX: CHOLECYSTECTOMY: SHX55

## 2021-05-08 LAB — POCT PREGNANCY, URINE: Preg Test, Ur: NEGATIVE

## 2021-05-08 SURGERY — LAPAROSCOPIC CHOLECYSTECTOMY
Anesthesia: General

## 2021-05-08 MED ORDER — SODIUM CHLORIDE 0.9 % IR SOLN
Status: DC | PRN
  Administered 2021-05-08: 1000 mL

## 2021-05-08 MED ORDER — CEFAZOLIN SODIUM-DEXTROSE 2-4 GM/100ML-% IV SOLN
INTRAVENOUS | Status: AC
  Filled 2021-05-08: qty 100

## 2021-05-08 MED ORDER — CHLORHEXIDINE GLUCONATE CLOTH 2 % EX PADS: 6.0000 | MEDICATED_PAD | Freq: Once | CUTANEOUS | Status: DC

## 2021-05-08 MED ORDER — PROPOFOL 10 MG/ML IV BOLUS
INTRAVENOUS | Status: DC | PRN
  Administered 2021-05-08: 150 mg via INTRAVENOUS

## 2021-05-08 MED ORDER — SCOPOLAMINE 1 MG/3DAYS TD PT72
1.0000 | MEDICATED_PATCH | TRANSDERMAL | Status: DC
  Administered 2021-05-08: 1.5 mg via TRANSDERMAL

## 2021-05-08 MED ORDER — ROCURONIUM BROMIDE 100 MG/10ML IV SOLN
INTRAVENOUS | Status: DC | PRN
  Administered 2021-05-08: 20 mg via INTRAVENOUS
  Administered 2021-05-08: 50 mg via INTRAVENOUS

## 2021-05-08 MED ORDER — CELECOXIB 200 MG PO CAPS: 200.0000 mg | ORAL_CAPSULE | ORAL | Status: AC

## 2021-05-08 MED ORDER — FENTANYL CITRATE (PF) 100 MCG/2ML IJ SOLN
25.0000 ug | INTRAMUSCULAR | Status: DC | PRN
  Administered 2021-05-08: 25 ug via INTRAVENOUS
  Administered 2021-05-08 (×2): 50 ug via INTRAVENOUS

## 2021-05-08 MED ORDER — ONDANSETRON HCL 4 MG/2ML IJ SOLN
INTRAMUSCULAR | Status: DC | PRN
  Administered 2021-05-08: 4 mg via INTRAVENOUS

## 2021-05-08 MED ORDER — BUPIVACAINE-EPINEPHRINE (PF) 0.25% -1:200000 IJ SOLN
INTRAMUSCULAR | Status: DC | PRN
  Administered 2021-05-08: 50 mL

## 2021-05-08 MED ORDER — LIDOCAINE HCL (PF) 2 % IJ SOLN
INTRAMUSCULAR | Status: AC
  Filled 2021-05-08: qty 5

## 2021-05-08 MED ORDER — MIDAZOLAM HCL 2 MG/2ML IJ SOLN
INTRAMUSCULAR | Status: AC
  Filled 2021-05-08: qty 2

## 2021-05-08 MED ORDER — CELECOXIB 200 MG PO CAPS
ORAL_CAPSULE | ORAL | Status: AC
  Administered 2021-05-08: 200 mg via ORAL
  Filled 2021-05-08: qty 1

## 2021-05-08 MED ORDER — LACTATED RINGERS IV SOLN: INTRAVENOUS | Status: DC

## 2021-05-08 MED ORDER — FENTANYL CITRATE (PF) 100 MCG/2ML IJ SOLN
INTRAMUSCULAR | Status: AC
  Administered 2021-05-08: 50 ug via INTRAVENOUS
  Filled 2021-05-08: qty 2

## 2021-05-08 MED ORDER — ACETAMINOPHEN 10 MG/ML IV SOLN: 1000.0000 mg | Freq: Once | INTRAVENOUS | Status: DC | PRN

## 2021-05-08 MED ORDER — CHLORHEXIDINE GLUCONATE 0.12 % MT SOLN
OROMUCOSAL | Status: AC
  Administered 2021-05-08: 15 mL via OROMUCOSAL
  Filled 2021-05-08: qty 15

## 2021-05-08 MED ORDER — GABAPENTIN 300 MG PO CAPS: 300.0000 mg | ORAL_CAPSULE | ORAL | Status: AC

## 2021-05-08 MED ORDER — FENTANYL CITRATE (PF) 100 MCG/2ML IJ SOLN
INTRAMUSCULAR | Status: AC
  Filled 2021-05-08: qty 2

## 2021-05-08 MED ORDER — DROPERIDOL 2.5 MG/ML IJ SOLN
0.6250 mg | Freq: Once | INTRAMUSCULAR | Status: DC | PRN
  Filled 2021-05-08: qty 2

## 2021-05-08 MED ORDER — FAMOTIDINE 20 MG PO TABS
ORAL_TABLET | ORAL | Status: AC
  Administered 2021-05-08: 20 mg via ORAL
  Filled 2021-05-08: qty 1

## 2021-05-08 MED ORDER — HYDROCODONE-ACETAMINOPHEN 5-325 MG PO TABS: 1.0000 | ORAL_TABLET | ORAL | 0 refills | Status: DC | PRN

## 2021-05-08 MED ORDER — GABAPENTIN 300 MG PO CAPS
ORAL_CAPSULE | ORAL | Status: AC
  Administered 2021-05-08: 300 mg via ORAL
  Filled 2021-05-08: qty 1

## 2021-05-08 MED ORDER — OXYCODONE HCL 5 MG PO TABS: 5.0000 mg | ORAL_TABLET | Freq: Once | ORAL | Status: AC | PRN

## 2021-05-08 MED ORDER — SCOPOLAMINE 1 MG/3DAYS TD PT72
MEDICATED_PATCH | TRANSDERMAL | Status: AC
  Filled 2021-05-08: qty 1

## 2021-05-08 MED ORDER — INDOCYANINE GREEN 25 MG IV SOLR
2.5000 mg | Freq: Once | INTRAVENOUS | Status: AC
  Administered 2021-05-08: 2.5 mg via INTRAVENOUS
  Filled 2021-05-08: qty 1

## 2021-05-08 MED ORDER — CHLORHEXIDINE GLUCONATE 0.12 % MT SOLN: 15.0000 mL | Freq: Once | OROMUCOSAL | Status: AC

## 2021-05-08 MED ORDER — OXYCODONE HCL 5 MG/5ML PO SOLN: 5.0000 mg | Freq: Once | ORAL | Status: AC | PRN

## 2021-05-08 MED ORDER — BUPIVACAINE LIPOSOME 1.3 % IJ SUSP
INTRAMUSCULAR | Status: AC
  Filled 2021-05-08: qty 20

## 2021-05-08 MED ORDER — DEXAMETHASONE SODIUM PHOSPHATE 10 MG/ML IJ SOLN
INTRAMUSCULAR | Status: DC | PRN
  Administered 2021-05-08: 10 mg via INTRAVENOUS

## 2021-05-08 MED ORDER — 0.9 % SODIUM CHLORIDE (POUR BTL) OPTIME
TOPICAL | Status: DC | PRN
  Administered 2021-05-08: 50 mL

## 2021-05-08 MED ORDER — PROMETHAZINE HCL 25 MG/ML IJ SOLN: 6.2500 mg | INTRAMUSCULAR | Status: DC | PRN

## 2021-05-08 MED ORDER — CEFAZOLIN SODIUM-DEXTROSE 2-4 GM/100ML-% IV SOLN
2.0000 g | INTRAVENOUS | Status: AC
  Administered 2021-05-08: 2 g via INTRAVENOUS

## 2021-05-08 MED ORDER — DEXMEDETOMIDINE (PRECEDEX) IN NS 20 MCG/5ML (4 MCG/ML) IV SYRINGE
PREFILLED_SYRINGE | INTRAVENOUS | Status: DC | PRN
  Administered 2021-05-08 (×2): 4 ug via INTRAVENOUS

## 2021-05-08 MED ORDER — MIDAZOLAM HCL 2 MG/2ML IJ SOLN
INTRAMUSCULAR | Status: DC | PRN
  Administered 2021-05-08: 2 mg via INTRAVENOUS

## 2021-05-08 MED ORDER — PROPOFOL 10 MG/ML IV BOLUS
INTRAVENOUS | Status: AC
  Filled 2021-05-08: qty 20

## 2021-05-08 MED ORDER — BUPIVACAINE-EPINEPHRINE (PF) 0.25% -1:200000 IJ SOLN
INTRAMUSCULAR | Status: AC
  Filled 2021-05-08: qty 30

## 2021-05-08 MED ORDER — SUGAMMADEX SODIUM 200 MG/2ML IV SOLN
INTRAVENOUS | Status: DC | PRN
  Administered 2021-05-08: 200 mg via INTRAVENOUS

## 2021-05-08 MED ORDER — FENTANYL CITRATE (PF) 100 MCG/2ML IJ SOLN
INTRAMUSCULAR | Status: AC
  Administered 2021-05-08: 25 ug via INTRAVENOUS
  Filled 2021-05-08: qty 2

## 2021-05-08 MED ORDER — ACETAMINOPHEN 500 MG PO TABS
ORAL_TABLET | ORAL | Status: AC
  Administered 2021-05-08: 1000 mg via ORAL
  Filled 2021-05-08: qty 2

## 2021-05-08 MED ORDER — FENTANYL CITRATE (PF) 100 MCG/2ML IJ SOLN
INTRAMUSCULAR | Status: DC | PRN
  Administered 2021-05-08 (×2): 50 ug via INTRAVENOUS

## 2021-05-08 MED ORDER — ORAL CARE MOUTH RINSE: 15.0000 mL | Freq: Once | OROMUCOSAL | Status: AC

## 2021-05-08 MED ORDER — PROPOFOL 500 MG/50ML IV EMUL
INTRAVENOUS | Status: DC | PRN
  Administered 2021-05-08: 10 ug/kg/min via INTRAVENOUS

## 2021-05-08 MED ORDER — LIDOCAINE HCL (CARDIAC) PF 100 MG/5ML IV SOSY
PREFILLED_SYRINGE | INTRAVENOUS | Status: DC | PRN
  Administered 2021-05-08: 60 mg via INTRAVENOUS

## 2021-05-08 MED ORDER — FAMOTIDINE 20 MG PO TABS: 20.0000 mg | ORAL_TABLET | Freq: Once | ORAL | Status: AC

## 2021-05-08 MED ORDER — OXYCODONE HCL 5 MG PO TABS
ORAL_TABLET | ORAL | Status: AC
  Administered 2021-05-08: 5 mg via ORAL
  Filled 2021-05-08: qty 1

## 2021-05-08 MED ORDER — ACETAMINOPHEN 500 MG PO TABS: 1000.0000 mg | ORAL_TABLET | ORAL | Status: AC

## 2021-05-08 SURGICAL SUPPLY — 36 items
ADH SKN CLS APL DERMABOND .7 (GAUZE/BANDAGES/DRESSINGS) ×2
BAG SPEC RTRVL LRG 6X4 10 (ENDOMECHANICALS) ×2
DECANTER SPIKE VIAL GLASS SM (MISCELLANEOUS) ×1 IMPLANT
DERMABOND ADVANCED (GAUZE/BANDAGES/DRESSINGS) ×2
DERMABOND ADVANCED .7 DNX12 (GAUZE/BANDAGES/DRESSINGS) ×2 IMPLANT
ELECT CAUTERY BLADE 6.4 (BLADE) ×4 IMPLANT
ELECT REM PT RETURN 9FT ADLT (ELECTROSURGICAL) ×4
ELECTRODE REM PT RTRN 9FT ADLT (ELECTROSURGICAL) ×2 IMPLANT
GAUZE 4X4 16PLY ~~LOC~~+RFID DBL (SPONGE) ×4 IMPLANT
GLOVE SURG ENC MOIS LTX SZ7 (GLOVE) ×8 IMPLANT
GOWN STRL REUS W/ TWL LRG LVL3 (GOWN DISPOSABLE) ×6 IMPLANT
GOWN STRL REUS W/TWL LRG LVL3 (GOWN DISPOSABLE) ×8
IRRIGATION STRYKERFLOW (MISCELLANEOUS) ×1 IMPLANT
IRRIGATOR STRYKERFLOW (MISCELLANEOUS) ×4
IV NS 1000ML (IV SOLUTION)
IV NS 1000ML BAXH (IV SOLUTION) IMPLANT
L-HOOK LAP DISP 36CM (ELECTROSURGICAL) ×4
LABEL OR SOLS (LABEL) ×4 IMPLANT
LHOOK LAP DISP 36CM (ELECTROSURGICAL) ×1 IMPLANT
MANIFOLD NEPTUNE II (INSTRUMENTS) ×4 IMPLANT
NEEDLE HYPO 22GX1.5 SAFETY (NEEDLE) ×4 IMPLANT
NS IRRIG 500ML POUR BTL (IV SOLUTION) ×4 IMPLANT
PACK LAP CHOLECYSTECTOMY (MISCELLANEOUS) ×4 IMPLANT
PENCIL ELECTRO HAND CTR (MISCELLANEOUS) ×4 IMPLANT
POUCH SPECIMEN RETRIEVAL 10MM (ENDOMECHANICALS) ×4 IMPLANT
SET TUBE SMOKE EVAC HIGH FLOW (TUBING) ×4 IMPLANT
SLEEVE ENDOPATH XCEL 5M (ENDOMECHANICALS) ×6 IMPLANT
SPONGE T-LAP 18X18 ~~LOC~~+RFID (SPONGE) ×4 IMPLANT
SPONGE T-LAP 4X18 ~~LOC~~+RFID (SPONGE) IMPLANT
SUT MNCRL AB 4-0 PS2 18 (SUTURE) ×4 IMPLANT
SUT VICRYL 0 AB UR-6 (SUTURE) ×8 IMPLANT
SYR 20ML LL LF (SYRINGE) ×4 IMPLANT
SYR 30ML LL (SYRINGE) ×4 IMPLANT
TROCAR XCEL BLUNT TIP 100MML (ENDOMECHANICALS) ×3 IMPLANT
TROCAR XCEL NON-BLD 5MMX100MML (ENDOMECHANICALS) ×3 IMPLANT
WATER STERILE IRR 500ML POUR (IV SOLUTION) ×1 IMPLANT

## 2021-05-08 NOTE — Anesthesia Preprocedure Evaluation (Addendum)
Anesthesia Evaluation  Patient identified by MRN, date of birth, ID band Patient awake    Reviewed: Allergy & Precautions, NPO status , Patient's Chart, lab work & pertinent test results  Airway Mallampati: II  TM Distance: >3 FB Neck ROM: Full    Dental no notable dental hx.    Pulmonary neg pulmonary ROS,    Pulmonary exam normal breath sounds clear to auscultation       Cardiovascular Exercise Tolerance: Good negative cardio ROS Normal cardiovascular exam Rhythm:Regular Rate:Normal     Neuro/Psych negative neurological ROS  negative psych ROS   GI/Hepatic Neg liver ROS, cholelithiasis   Endo/Other  negative endocrine ROS  Renal/GU negative Renal ROS  negative genitourinary   Musculoskeletal negative musculoskeletal ROS (+)   Abdominal Normal abdominal exam  (+)   Peds negative pediatric ROS (+)  Hematology negative hematology ROS (+)   Anesthesia Other Findings   Reproductive/Obstetrics negative OB ROS                                                             Anesthesia Evaluation  Patient identified by MRN, date of birth, ID band Patient awake    Reviewed: Allergy & Precautions, NPO status , Patient's Chart, lab work & pertinent test results  History of Anesthesia Complications Negative for: history of anesthetic complications  Airway Mallampati: II  TM Distance: >3 FB Neck ROM: Full    Dental no notable dental hx.    Pulmonary neg pulmonary ROS, neg sleep apnea, neg COPD,    breath sounds clear to auscultation- rhonchi (-) wheezing      Cardiovascular Exercise Tolerance: Good (-) hypertension(-) CAD and (-) Past MI  Rhythm:Regular Rate:Normal - Systolic murmurs and - Diastolic murmurs    Neuro/Psych negative neurological ROS  negative psych ROS   GI/Hepatic negative GI ROS, Neg liver ROS,   Endo/Other  negative endocrine ROSneg diabetes   Renal/GU negative Renal ROS     Musculoskeletal negative musculoskeletal ROS (+)   Abdominal (+) - obese,   Peds  Hematology negative hematology ROS (+)   Anesthesia Other Findings   Reproductive/Obstetrics                             Anesthesia Physical Anesthesia Plan  ASA: 1  Anesthesia Plan: General   Post-op Pain Management:    Induction: Intravenous  PONV Risk Score and Plan: 2 and Ondansetron, Dexamethasone and Midazolam  Airway Management Planned: Oral ETT  Additional Equipment:   Intra-op Plan:   Post-operative Plan: Extubation in OR  Informed Consent: I have reviewed the patients History and Physical, chart, labs and discussed the procedure including the risks, benefits and alternatives for the proposed anesthesia with the patient or authorized representative who has indicated his/her understanding and acceptance.     Dental advisory given  Plan Discussed with: CRNA and Anesthesiologist  Anesthesia Plan Comments:         Anesthesia Quick Evaluation  Anesthesia Physical Anesthesia Plan  ASA: 1  Anesthesia Plan: General ETT   Post-op Pain Management:    Induction: Intravenous  PONV Risk Score and Plan: Ondansetron, Dexamethasone, Scopolamine patch - Pre-op and Midazolam  Airway Management Planned: Oral ETT  Additional Equipment:   Intra-op Plan:  Post-operative Plan: Extubation in OR  Informed Consent: I have reviewed the patients History and Physical, chart, labs and discussed the procedure including the risks, benefits and alternatives for the proposed anesthesia with the patient or authorized representative who has indicated his/her understanding and acceptance.     Dental Advisory Given  Plan Discussed with: Anesthesiologist, CRNA and Surgeon  Anesthesia Plan Comments: (Patient consented for risks of anesthesia including but not limited to:  - adverse reactions to medications - damage to eyes,  teeth, lips or other oral mucosa - nerve damage due to positioning  - sore throat or hoarseness - Damage to heart, brain, nerves, lungs, other parts of body or loss of life  Patient voiced understanding.)        Anesthesia Quick Evaluation

## 2021-05-08 NOTE — OR Nursing (Signed)
D/C instructions included verbal to remove scopolamine patch tomorrow 5 pm and to wash hands well afterwards

## 2021-05-08 NOTE — Transfer of Care (Signed)
Immediate Anesthesia Transfer of Care Note  Patient: Brittany Richards  Procedure(s) Performed: LAPAROSCOPIC CHOLECYSTECTOMY  Patient Location: PACU  Anesthesia Type:General  Level of Consciousness: awake  Airway & Oxygen Therapy: Patient Spontanous Breathing and Patient connected to face mask oxygen  Post-op Assessment: Report given to RN and Post -op Vital signs reviewed and stable  Post vital signs: Reviewed and stable  Last Vitals:  Vitals Value Taken Time  BP 152/85 05/08/21 1456  Temp 36.4 C 05/08/21 1456  Pulse 74 05/08/21 1459  Resp 25 05/08/21 1459  SpO2 100 % 05/08/21 1459  Vitals shown include unvalidated device data.  Last Pain:  Vitals:   05/08/21 1456  TempSrc:   PainSc: Asleep         Complications: No notable events documented.

## 2021-05-08 NOTE — Interval H&P Note (Signed)
History and Physical Interval Note:  05/08/2021 11:30 AM  Brittany Richards  has presented today for surgery, with the diagnosis of cholelithiasis.  The various methods of treatment have been discussed with the patient and family. After consideration of risks, benefits and other options for treatment, the patient has consented to  Procedure(s): XI ROBOTIC ASSISTED LAPAROSCOPIC CHOLECYSTECTOMY (N/A) [place] ([facility]) (N/A) as a surgical intervention.  The patient's history has been reviewed, patient examined, no change in status, stable for surgery.  I have reviewed the patient's chart and labs.  Questions were answered to the patient's satisfaction.     [place]

## 2021-05-08 NOTE — Op Note (Signed)
Laparoscopic Cholecystectomy  Pre-operative Diagnosis: Biliary COlic   Post-operative Diagnosis: Same  Surgeon: Caroleen Hamman, MD FACS  Anesthesia: Gen. with endotracheal tube  Assistant:Dr. Pappayliou Required for assistance of laparoscopic case, Retraction and camera holding.  Findings: Cholelithiasis   Estimated Blood Loss: 5cc                Specimens: Gallbladder           Complications: none  Procedure Details  The patient was seen again in the Holding Room. The benefits, complications, treatment options, and expected outcomes were discussed with the patient. The risks of bleeding, infection, recurrence of symptoms, failure to resolve symptoms, bile duct damage, bile duct leak, retained common bile duct stone, bowel injury, any of which could require further surgery and/or ERCP, stent, or papillotomy were reviewed with the patient. The likelihood of improving the patient's symptoms with return to their baseline status is good.  The patient and/or family concurred with the proposed plan, giving informed consent.  The patient was taken to Operating Room, identified as Brittany Richards and the procedure verified as Laparoscopic Cholecystectomy.  A Time Out was held and the above information confirmed.  Prior to the induction of general anesthesia, antibiotic prophylaxis was administered. VTE prophylaxis was in place. General endotracheal anesthesia was then administered and tolerated well. After the induction, the abdomen was prepped with Chloraprep and draped in the sterile fashion. The patient was positioned in the supine position.  Cut down technique was used to enter the abdominal cavity and a Hasson trochar was placed after two vicryl stitches were anchored to the fascia. Pneumoperitoneum was then created with CO2 and tolerated well without any adverse changes in the patient's vital signs.  Three 5-mm ports were placed in the right upper quadrant all under direct vision.  The  patient was positioned  in reverse Trendelenburg, tilted slightly to the patient's left.  The gallbladder was identified, the fundus grasped and retracted cephalad. Adhesions were lysed bluntly. The infundibulum was grasped and retracted laterally, exposing the peritoneum overlying the triangle of Calot. This was then divided and exposed in a blunt fashion. An extended critical view of the cystic duct and cystic artery was obtained.  The cystic duct was clearly identified and bluntly dissected.   Artery and duct were double clipped and divided.  The gallbladder was taken from the gallbladder fossa in a retrograde fashion with the electrocautery. The gallbladder was removed and placed in an Endocatch bag. The liver bed was irrigated and inspected. Hemostasis was achieved with the electrocautery. Copious irrigation was utilized and was repeatedly aspirated until clear.  The gallbladder and Endocatch sac were then removed through a port site.    Inspection of the right upper quadrant was performed. No bleeding, bile duct injury or leak, or bowel injury was noted.  Pneumoperitoneum was released.   All skin incisions  were infiltrated with a local anesthetic agent , liposomal marcaine. The periumbilical port site was closed with interrumpted 0 Vicryl sutures. 4-0 subcuticular Monocryl was used to close the skin. Dermabond was  applied.  The patient was then extubated and brought to the recovery room in stable condition. Sponge, lap, and needle counts were correct at closure and at the conclusion of the case.               Caroleen Hamman, MD, FACS

## 2021-05-08 NOTE — Discharge Instructions (Addendum)
Laparoscopic Cholecystectomy, Care After  ° °These instructions give you information on caring for yourself after your procedure. Your doctor may also give you more specific instructions. Call your doctor if you have any problems or questions after your procedure.  °HOME CARE  °Change your bandages (dressings) as told by your doctor.  °Keep the wound dry and clean. Wash the wound gently with soap and water. Pat the wound dry with a clean towel.  °Do not take baths, swim, or use hot tubs for 2 weeks, or as told by your doctor.  °Only take medicine as told by your doctor.  °Eat a normal diet as told by your doctor.  °Do not lift anything heavier than 10 pounds (4.5 kg) until your doctor says it is okay.  °Do not play contact sports for 1 week, or as told by your doctor. °GET HELP IF:  °Your wound is red, puffy (swollen), or painful.  °You have yellowish-white fluid (pus) coming from the wound.  °You have fluid draining from the wound for more than 1 day.  °You have a bad smell coming from the wound.  °Your wound breaks open. °GET HELP RIGHT AWAY IF:  °You have trouble breathing.  °You have chest pain.  °You have a fever >101  °You have pain in the shoulders (shoulder strap areas) that is getting worse.  °You feel dizzy or pass out (faint).  °You have severe belly (abdominal) pain.  °You feel sick to your stomach (nauseous) or throw up (vomit) for more than 1 day. ° °AMBULATORY SURGERY  °DISCHARGE INSTRUCTIONS ° ° °The drugs that you were given will stay in your system until tomorrow so for the next 24 hours you should not: ° °Drive an automobile °Make any legal decisions °Drink any alcoholic beverage ° ° °You may resume regular meals tomorrow.  Today it is better to start with liquids and gradually work up to solid foods. ° °You may eat anything you prefer, but it is better to start with liquids, then soup and crackers, and gradually work up to solid foods. ° ° °Please notify your doctor immediately if you have any  unusual bleeding, trouble breathing, redness and pain at the surgery site, drainage, fever, or pain not relieved by medication. ° ° ° °Additional Instructions: °Please contact your physician with any problems or Same Day Surgery at [phone], Monday through Friday 6 am to 4 pm, or [facility] number at [phone].  °  °

## 2021-05-08 NOTE — Anesthesia Procedure Notes (Signed)
Procedure Name: Intubation Date/Time: 05/08/2021 1:50 PM Performed by: Lily Peer, Teeghan Hammer, CRNA Pre-anesthesia Checklist: Patient identified, Emergency Drugs available, Suction available and Patient being monitored Patient Re-evaluated:Patient Re-evaluated prior to induction Oxygen Delivery Method: Circle system utilized Preoxygenation: Pre-oxygenation with 100% oxygen Induction Type: IV induction Ventilation: Mask ventilation without difficulty Laryngoscope Size: McGraph and 3 Grade View: Grade I Tube type: Oral Number of attempts: 1 Airway Equipment and Method: Stylet Placement Confirmation: ETT inserted through vocal cords under direct vision, positive ETCO2 and breath sounds checked- equal and bilateral Secured at: 21 cm Tube secured with: Tape Dental Injury: Teeth and Oropharynx as per pre-operative assessment

## 2021-05-11 ENCOUNTER — Encounter: Payer: Self-pay | Admitting: Surgery

## 2021-05-12 LAB — SURGICAL PATHOLOGY

## 2021-05-16 NOTE — Anesthesia Postprocedure Evaluation (Signed)
Anesthesia Post Note  Patient: Brittany Richards  Procedure(s) Performed: LAPAROSCOPIC CHOLECYSTECTOMY  Patient location during evaluation: PACU Anesthesia Type: General Level of consciousness: awake and alert Pain management: pain level controlled Vital Signs Assessment: post-procedure vital signs reviewed and stable Respiratory status: spontaneous breathing, nonlabored ventilation and respiratory function stable Cardiovascular status: blood pressure returned to baseline and stable Postop Assessment: no apparent nausea or vomiting Anesthetic complications: no   No notable events documented.   Last Vitals:  Vitals:   05/08/21 1637 05/08/21 1652  BP:  (!) 152/89  Pulse: 71 73  Resp: 10 14  Temp:  36.5 C  SpO2: 94% 96%    Last Pain:  Vitals:   05/11/21 0832  TempSrc:   PainSc: 2                  [place] Ouch

## 2021-06-03 ENCOUNTER — Encounter: Payer: Self-pay | Admitting: Surgery

## 2021-06-03 ENCOUNTER — Other Ambulatory Visit: Payer: Self-pay

## 2021-06-03 ENCOUNTER — Ambulatory Visit (INDEPENDENT_AMBULATORY_CARE_PROVIDER_SITE_OTHER): Payer: Self-pay | Admitting: Surgery

## 2021-06-03 VITALS — BP 138/84 | HR 87 | Temp 99.0°F | Ht 62.0 in | Wt 147.0 lb

## 2021-06-03 DIAGNOSIS — K8050 Calculus of bile duct without cholangitis or cholecystitis without obstruction: Secondary | ICD-10-CM

## 2021-06-03 DIAGNOSIS — Z09 Encounter for follow-up examination after completed treatment for conditions other than malignant neoplasm: Secondary | ICD-10-CM

## 2021-06-03 NOTE — Patient Instructions (Signed)
   Follow-up with our office as needed.  Please call and ask to speak with a nurse if you develop questions or concerns.  

## 2021-06-04 NOTE — Progress Notes (Signed)
S/p lap chole Doing well Tolerating po ambulating, no fevers  PE NAD Abd: soft, nt.   A/P Doing well w/o complications RTC prn

## 2021-10-31 IMAGING — CT CT ABDOMEN W/ CM
2 of 4 series · 16 of 46 positions shown, 18 images · IV contrast (APPLIED)
Comparison: Same-day ultrasound

CLINICAL DATA: Abdominal wall mass, palpable abnormality, seen by
ultrasound

EXAM:
CT ABDOMEN WITH CONTRAST
TECHNIQUE: Multidetector CT imaging of the abdomen was performed using the
standard protocol following bolus administration of intravenous
contrast.
CONTRAST:  100mL OMNIPAQUE IOHEXOL 300 MG/ML  SOLN

[Series 2: routine abd/pel with · axial · 0.68mm/px · z∈[-939,-694]mm · 13 of 55 slices shown, 15 images]
[im 3/55  soft-tissue]
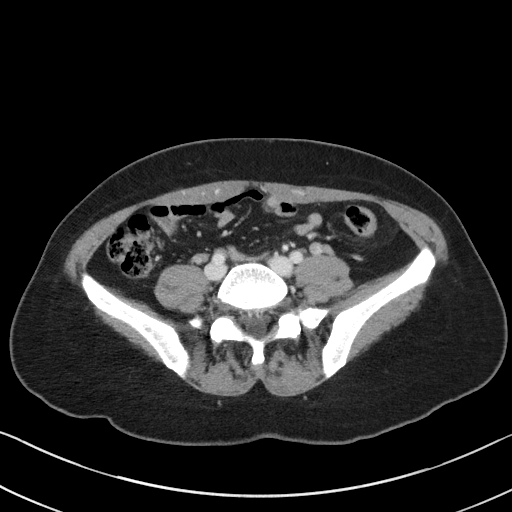
[im 3/55  bone]
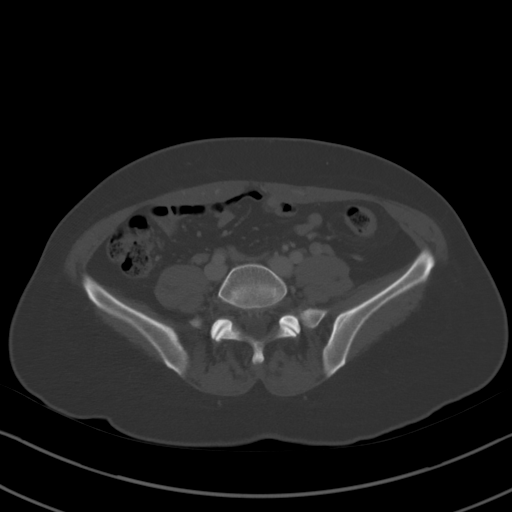
[im 8/55  soft-tissue]
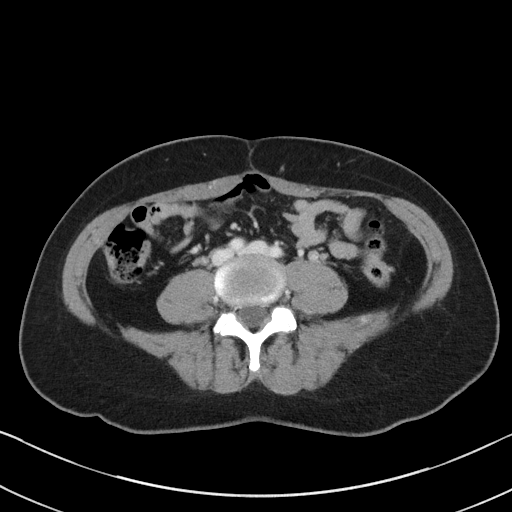
[im 11/55  soft-tissue]
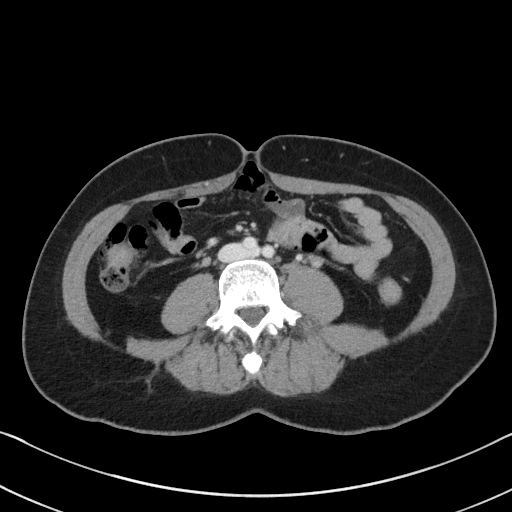
[im 16/55  soft-tissue]
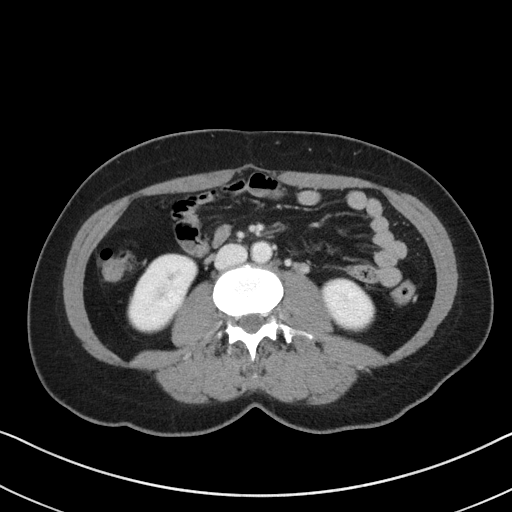
[im 19/55  soft-tissue]
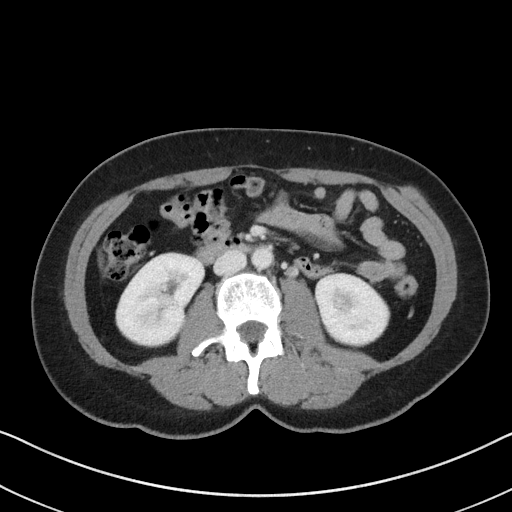
[im 24/55  soft-tissue]
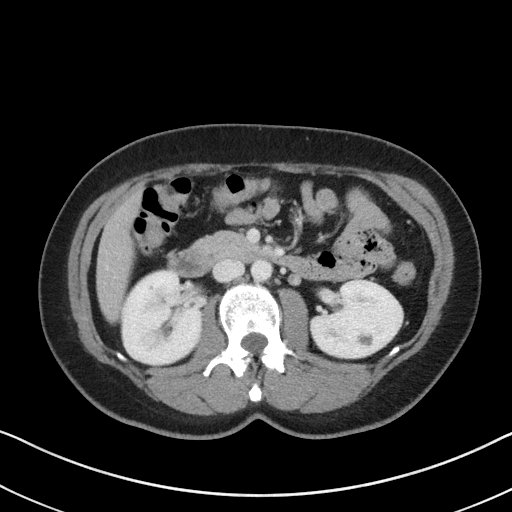
[im 29/55  soft-tissue]
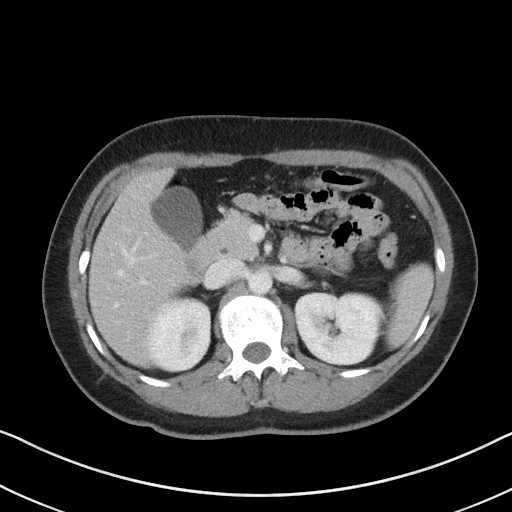
[im 31/55  soft-tissue]
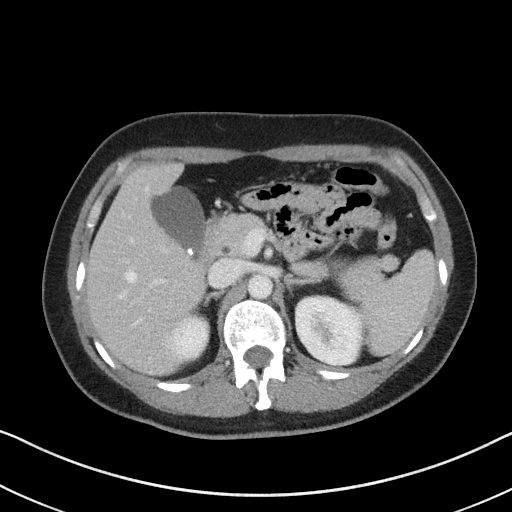
[im 37/55  soft-tissue]
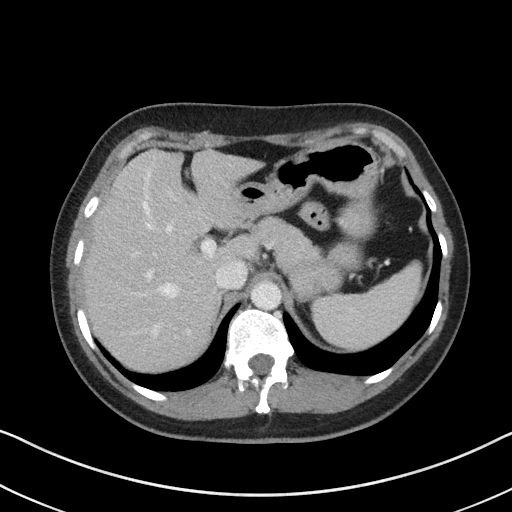
[im 37/55  bone]
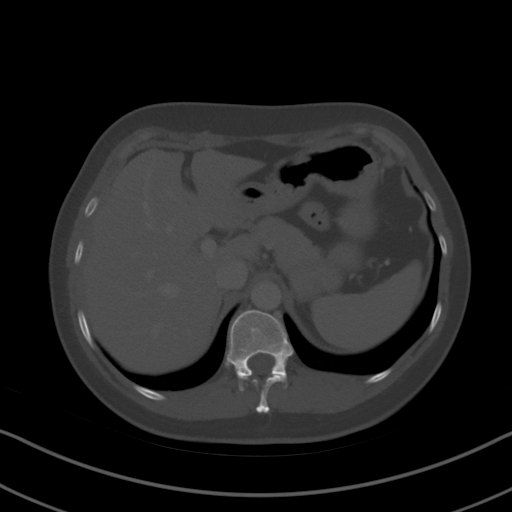
[im 39/55  soft-tissue]
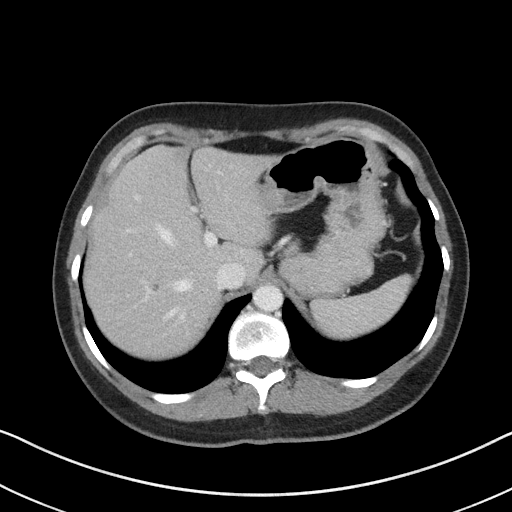
[im 44/55  soft-tissue]
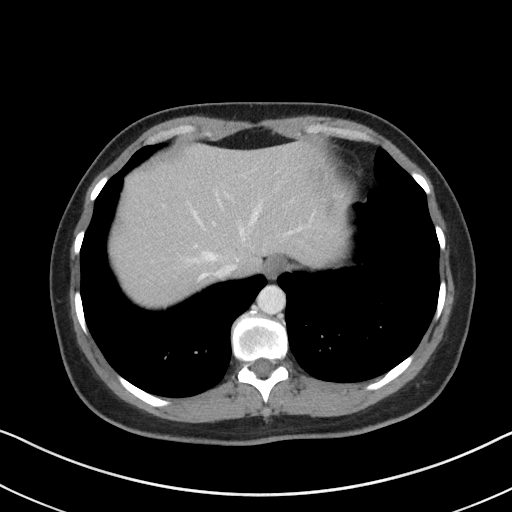
[im 47/55  soft-tissue]
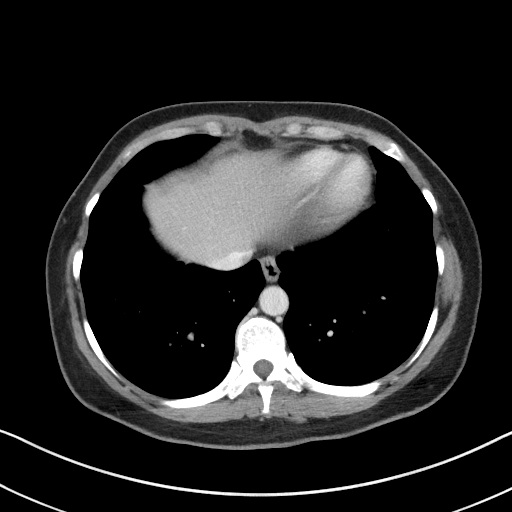
[im 52/55  soft-tissue]
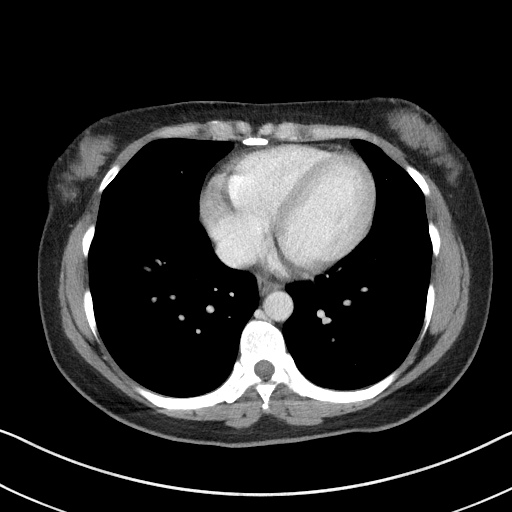

[Series 5: coronal st · coronal · 0.55mm/px · 3 of 78 slices shown]
[im 26/78  soft-tissue]
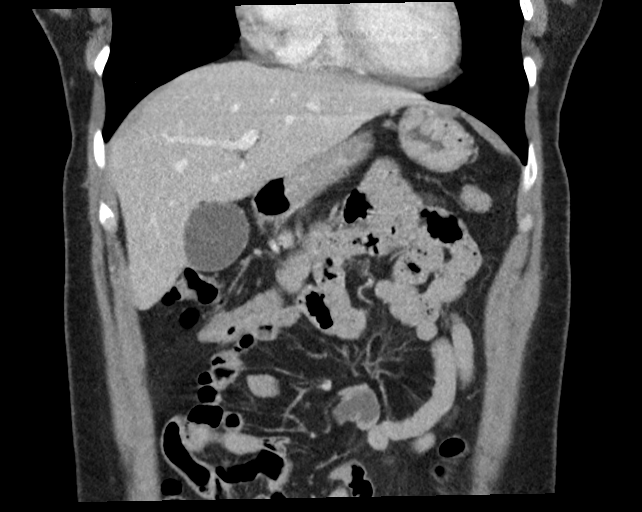
[im 35/78  soft-tissue]
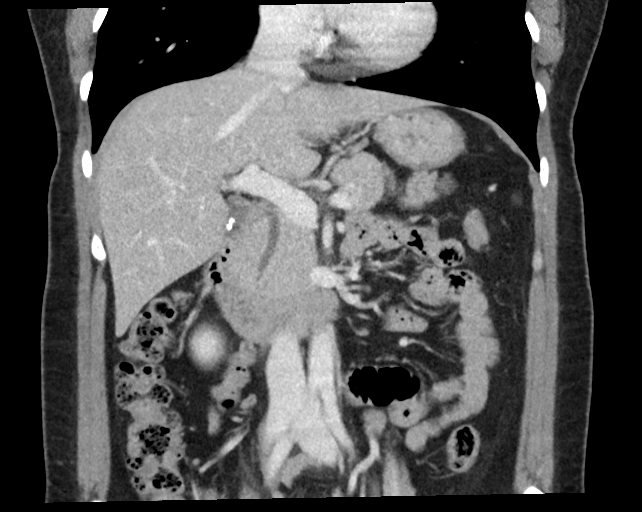
[im 43/78  soft-tissue]
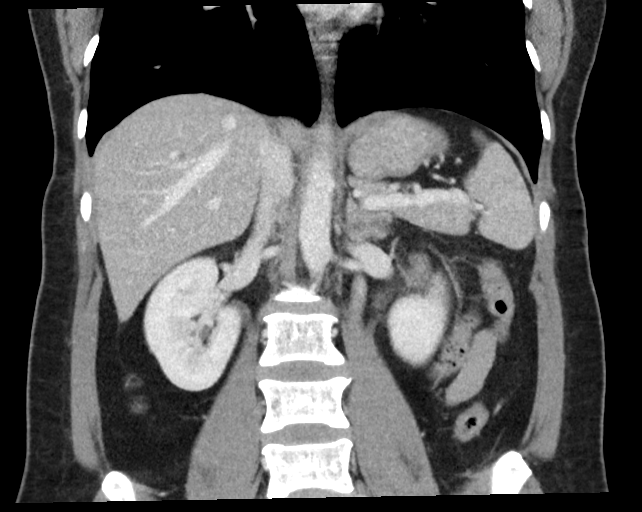

[16 of 46 positions shown; findings below may reference images not displayed]

FINDINGS: Lower chest: No acute abnormality.

Hepatobiliary: No solid liver abnormality is seen. Small gallstone
in the dependent gallbladder. Gallbladder wall thickening, or
biliary dilatation.

Pancreas: Unremarkable. No pancreatic ductal dilatation or
surrounding inflammatory changes.

Spleen: Normal in size without significant abnormality.

Adrenals/Urinary Tract: Adrenal glands are unremarkable. Kidneys are
normal, without renal calculi, solid lesion, or hydronephrosis.
Bladder is unremarkable.

Stomach/Bowel: Stomach is within normal limits. Appendix appears
normal. No evidence of bowel wall thickening, distention, or
inflammatory changes.

Vascular/Lymphatic: No significant vascular findings are present. No
enlarged abdominal lymph nodes.

Other: No abdominal wall hernia or abnormality. No abdominopelvic
ascites.

Musculoskeletal: There is an ill-defined, oval soft tissue mass in
the left external oblique musculature measuring approximately 3.0 x
2.7 x 1.4 cm, in keeping with palpable abnormality and previously
visualized by ultrasound.
IMPRESSION: 1. There is an ill-defined, oval soft tissue mass in the left
external oblique musculature measuring approximately 3.0 x 2.7 x
cm, in keeping with palpable abnormality and previously visualized
by ultrasound. This is nonspecific but concerning for soft tissue
malignancy. Differential considerations do include benign sequelae
of trauma. Nonemergent MRI may be helpful to further characterize,
particularly for solid character and contrast enhancement. Consider
tissue sampling and surgical referral.
2. Cholelithiasis.

## 2021-10-31 IMAGING — US US ABDOMEN LIMITED
1 series · 10 of 10 positions shown · non-contrast
Comparison: None.

CLINICAL DATA: Palpable mass left mid abdomen

EXAM:
ULTRASOUND ABDOMEN LIMITED

[Series 1: us abdomen limited · 10 acquisitions, 10 frames shown]
[im 1/10]
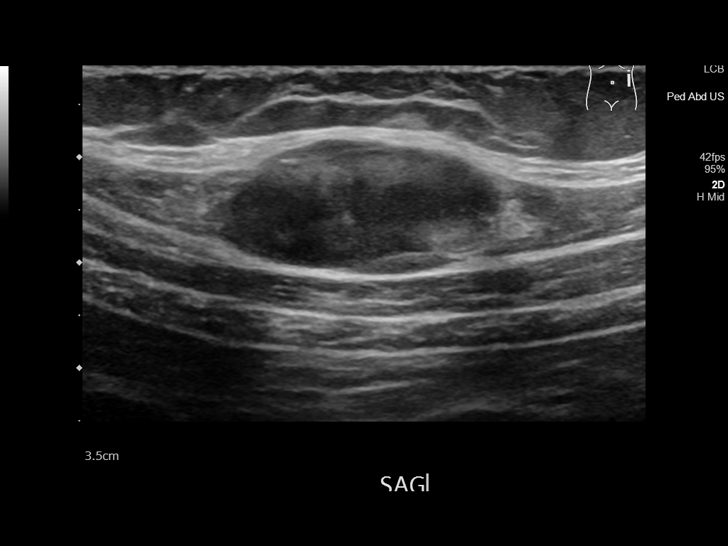
[im 2/10]
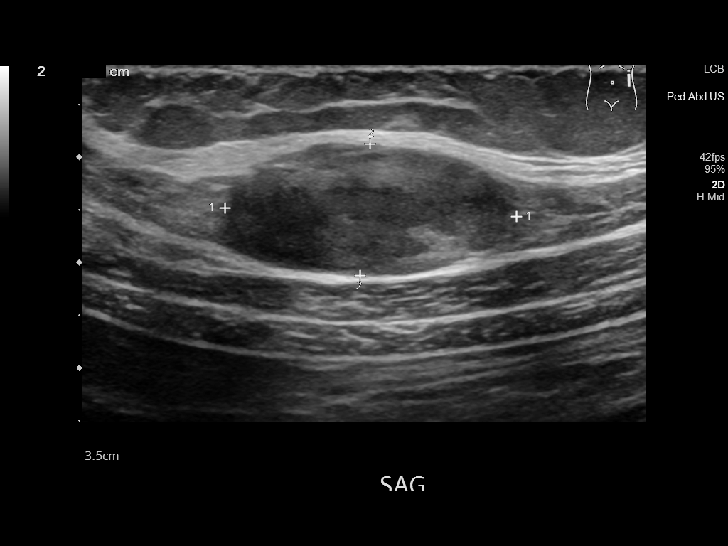
[im 3/10]
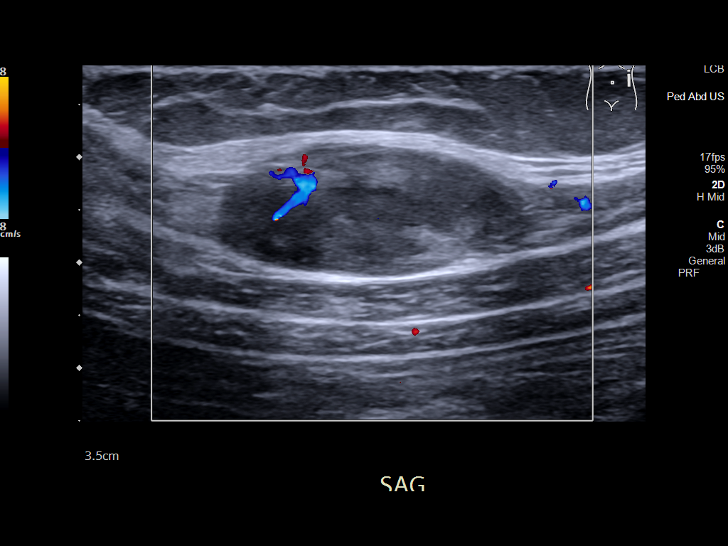
[im 4/10]
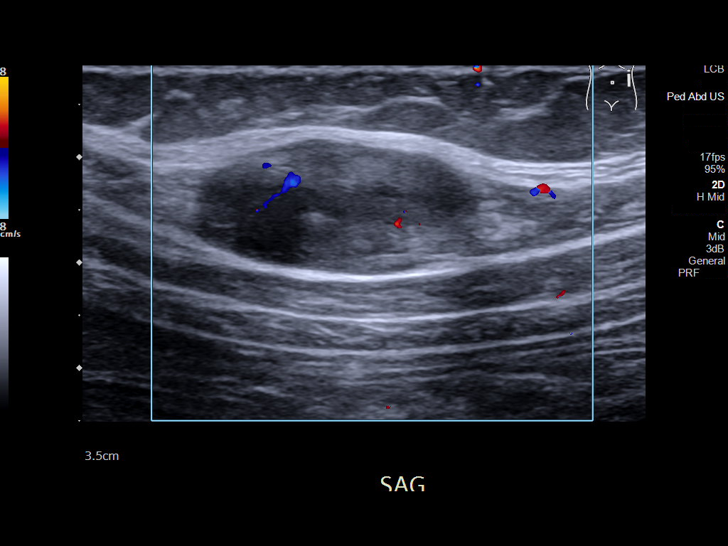
[im 5/10]
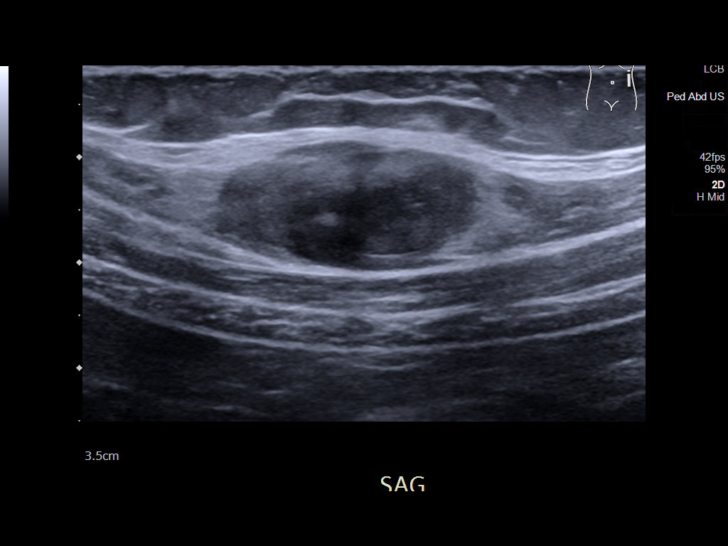
[im 6/10]
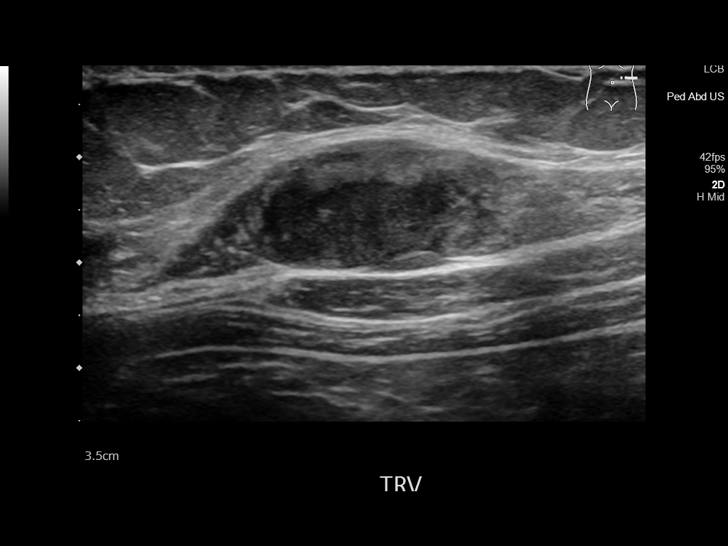
[im 7/10]
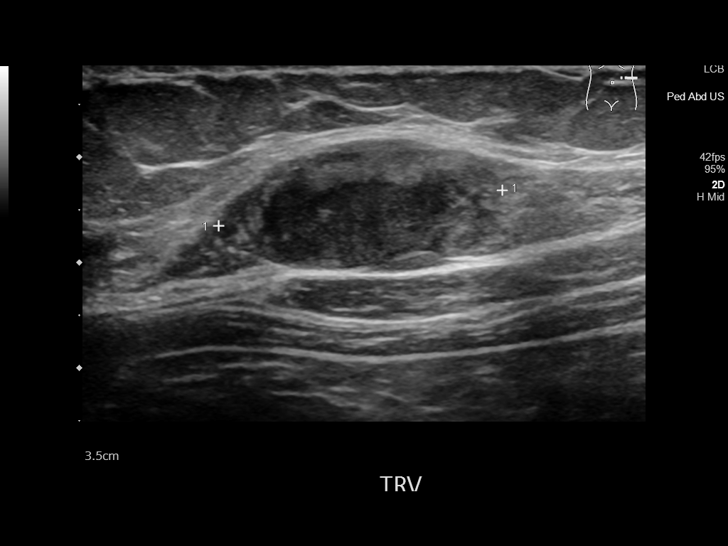
[im 8/10]
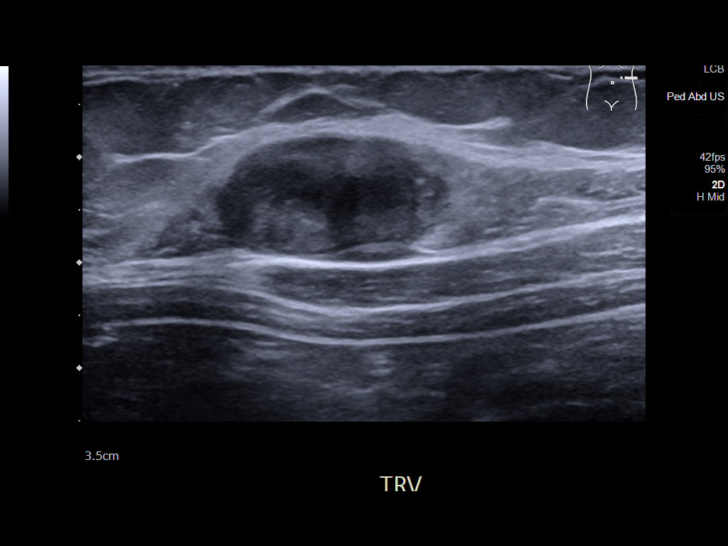
[im 9/10]
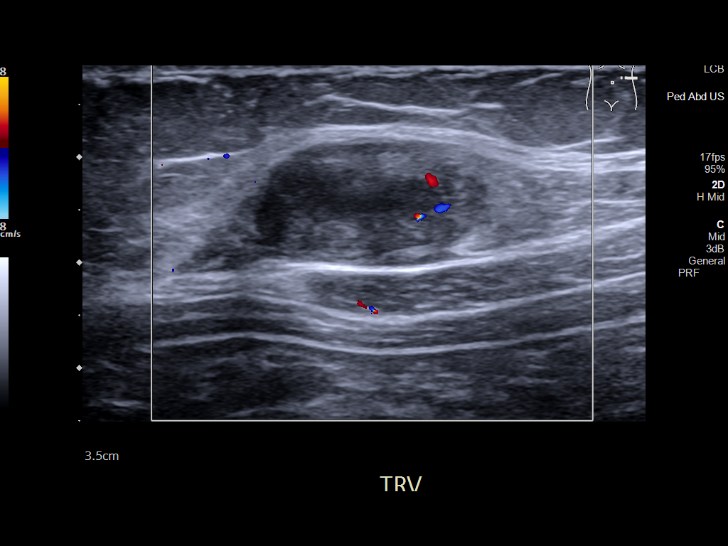
[im 10/10]
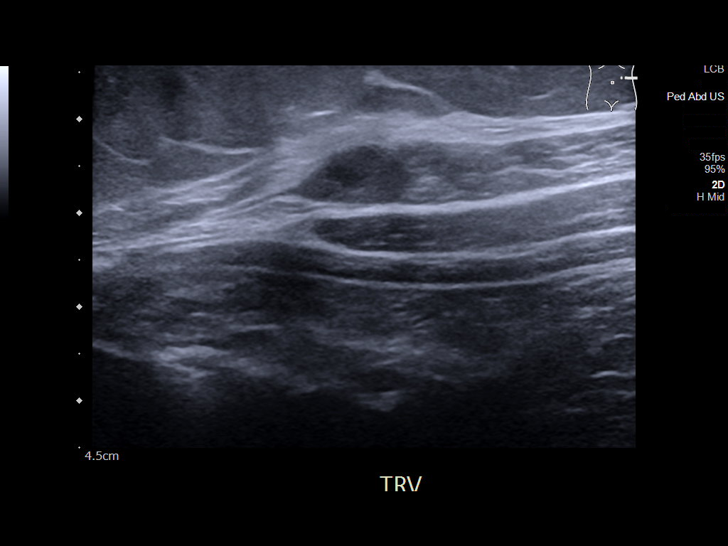

[10 of 10 positions shown; findings below may reference images not displayed]

FINDINGS: Sonographic evaluation of the palpable area in the left mid
abdominal wall was performed. Deep to the subcutaneous fat, there is
a heterogeneous hypoechoic circumscribed 2.8 x 1.3 x 2.8 cm solid
mass. This may involve the ventral aspect of the abdominal wall
musculature. There is minimal internal vascularity.

No evidence of underlying hernia.
IMPRESSION: 1. Indeterminate circumscribed solid mass within the left mid
abdominal wall corresponding to the palpable abnormality. Etiology,
but differential diagnosis is nonspecific based on ultrasound
characteristics could include intramuscular hematoma, intramuscular
lipoma, or other soft tissue mass. CT of the abdomen with IV
contrast may allow for better characterization.

## 2022-03-20 ENCOUNTER — Ambulatory Visit: Payer: Self-pay

## 2022-03-20 DIAGNOSIS — Z23 Encounter for immunization: Secondary | ICD-10-CM

## 2022-09-08 IMAGING — US US ABDOMEN LIMITED RUQ/ASCITES
1 series · 14 of 25 positions shown · non-contrast
Comparison: None.

CLINICAL DATA: Right upper quadrant pain for 4 days

EXAM:
ULTRASOUND ABDOMEN LIMITED RIGHT UPPER QUADRANT

[Series 1: us abdomen limited ruq (liver/gb) · 14 of 56 slices shown]
[im 1/56]
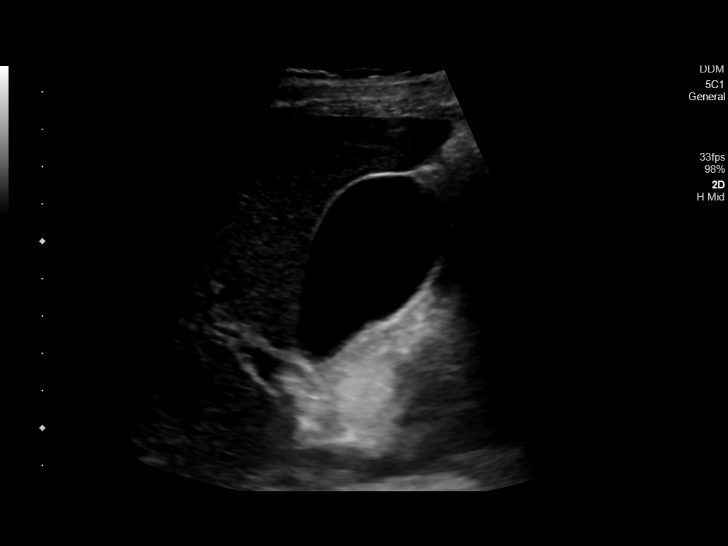
[im 5/56]
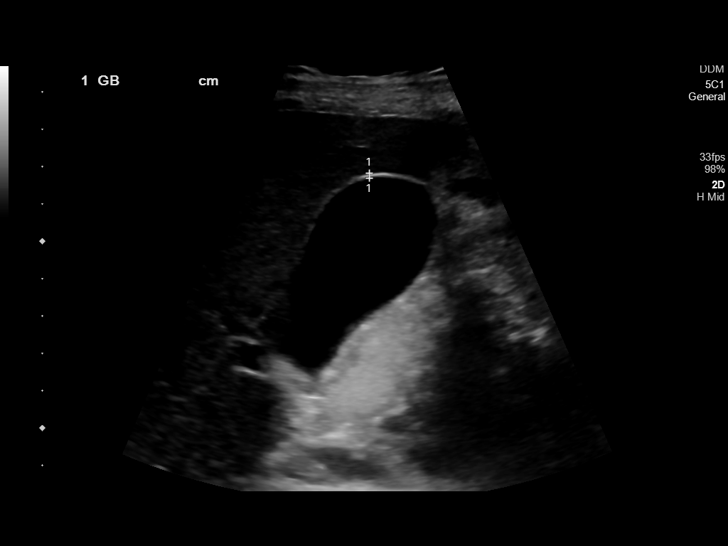
[im 10/56]
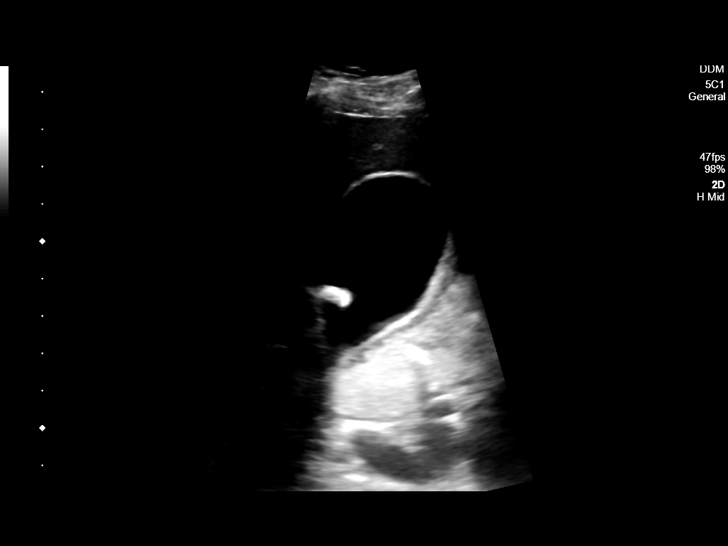
[im 14/56]
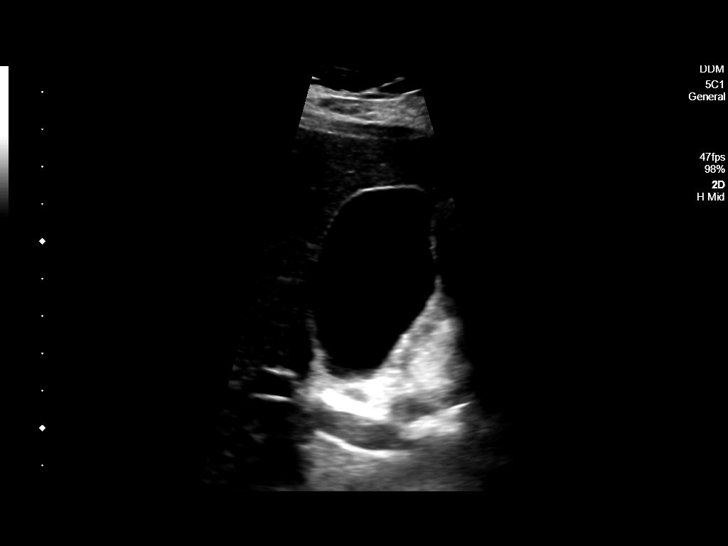
[im 19/56]
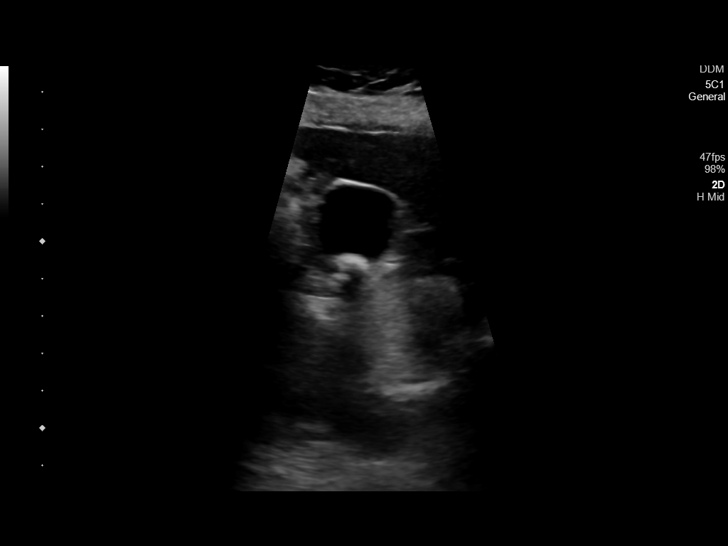
[im 21/56]
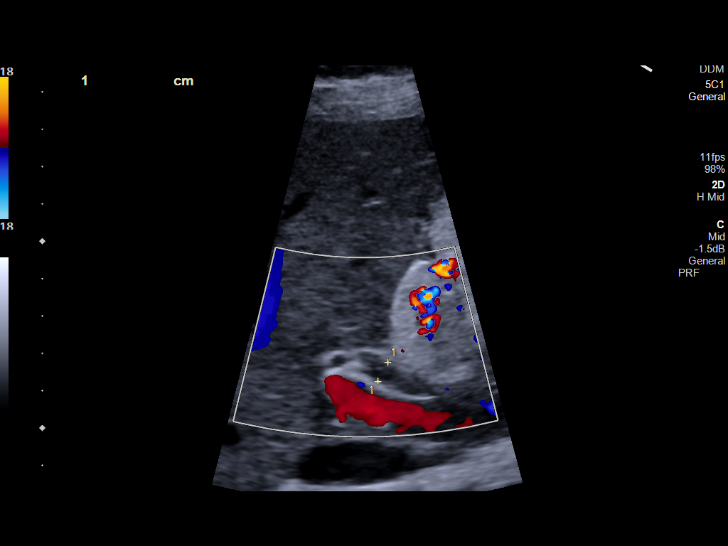
[im 26/56]
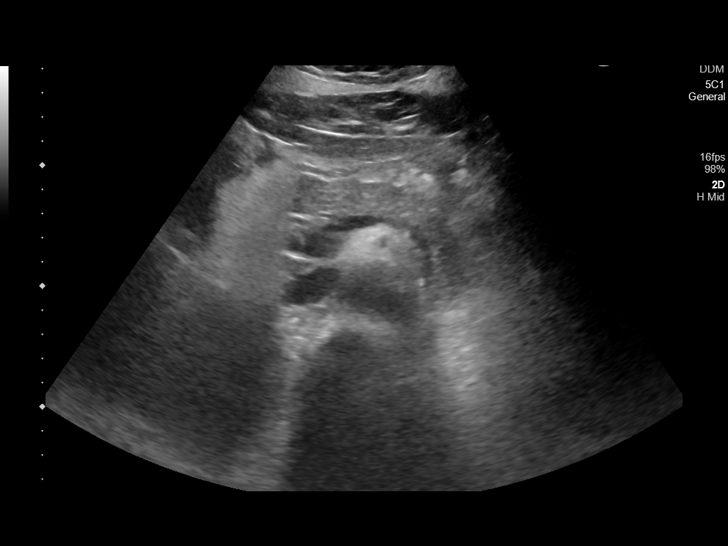
[im 30/56]
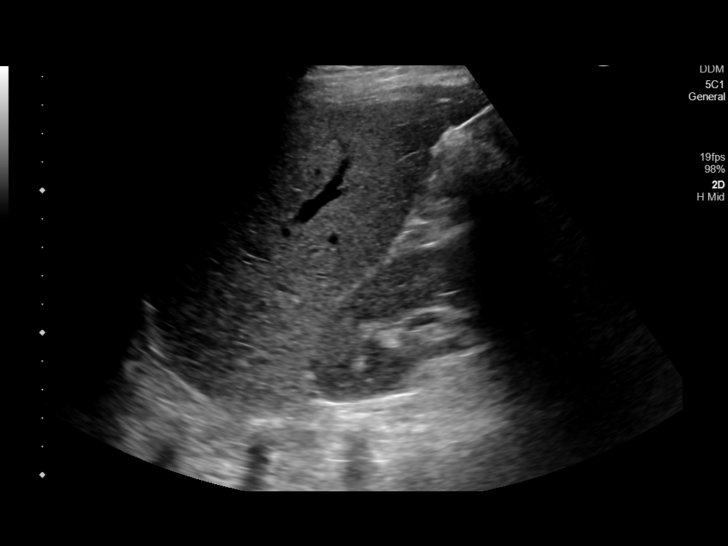
[im 35/56]
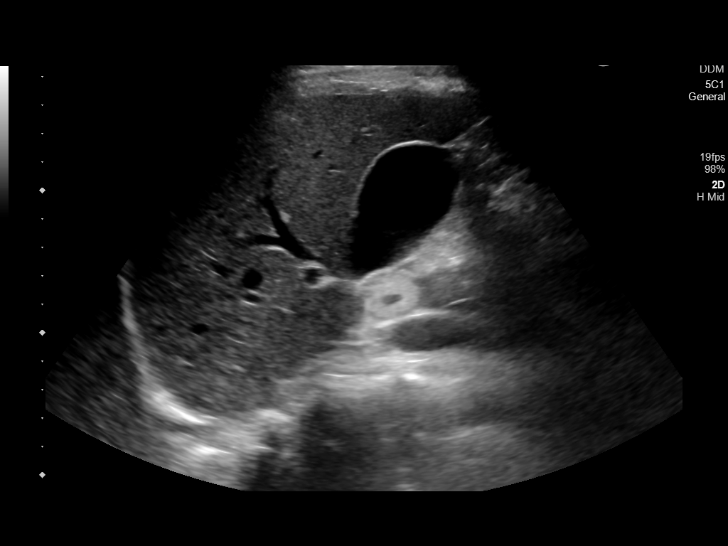
[im 37/56]
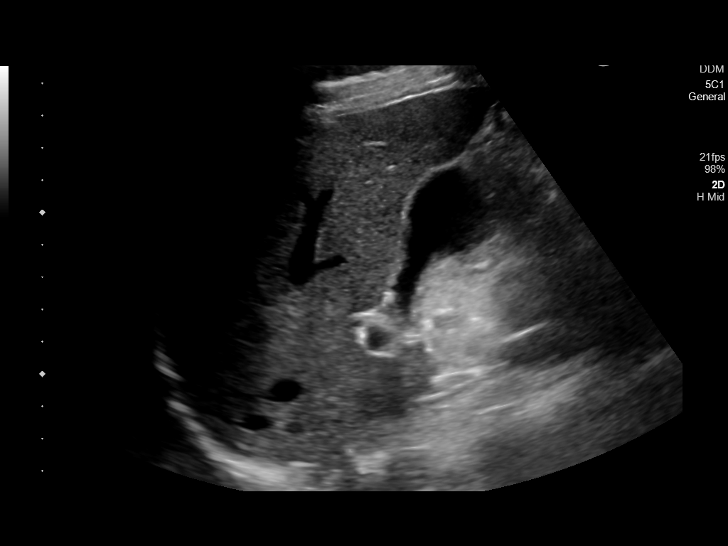
[im 42/56]
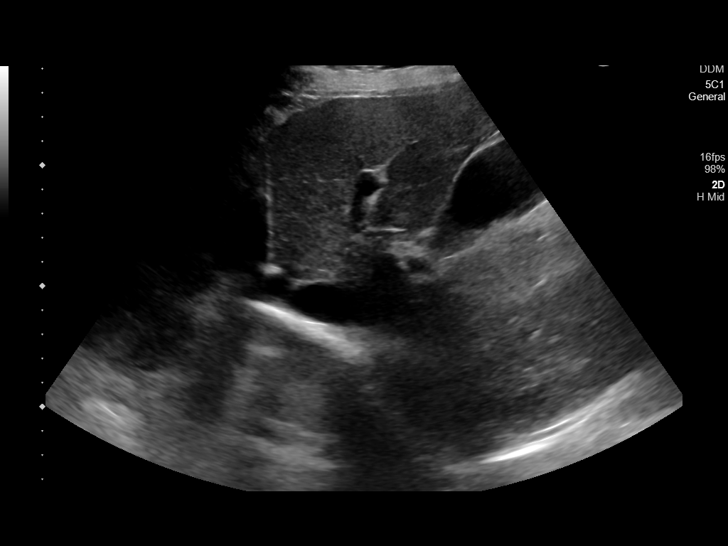
[im 46/56]
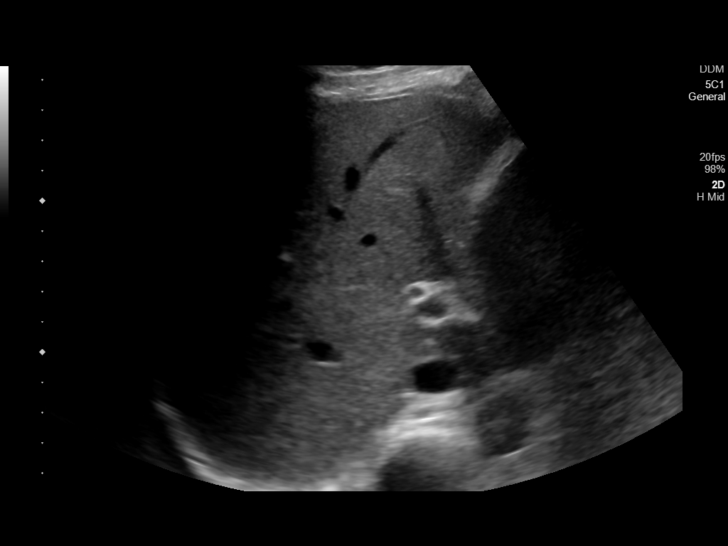
[im 51/56]
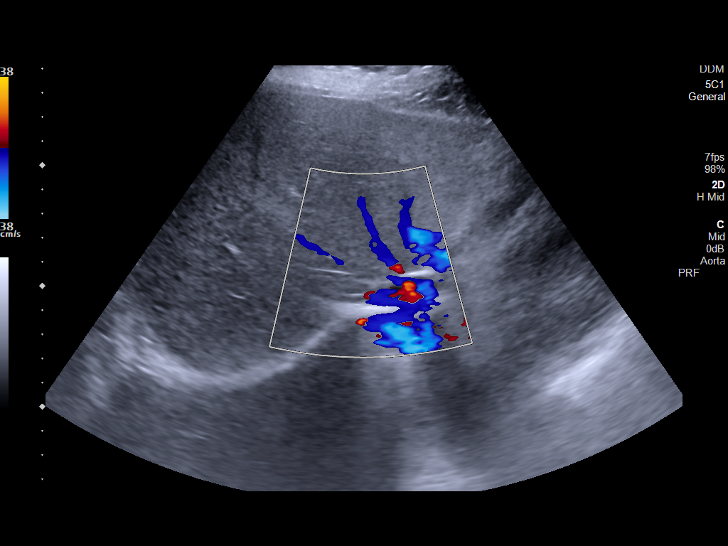
[im 56/56]
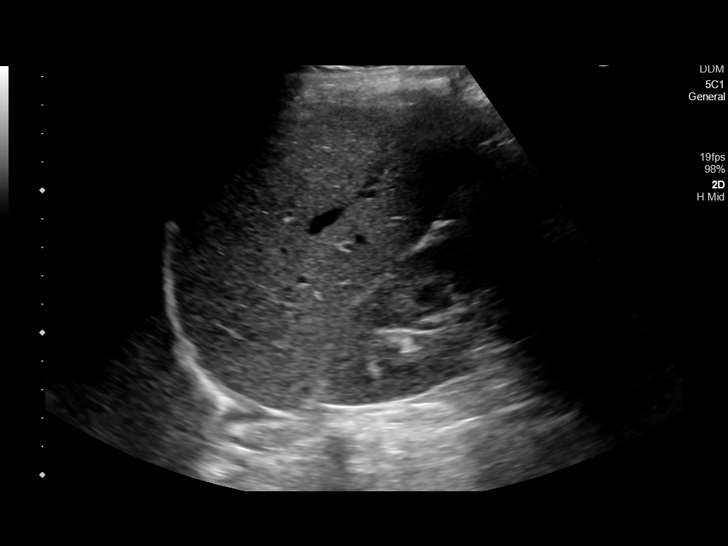

[14 of 25 positions shown; findings below may reference images not displayed]

FINDINGS: Gallbladder:

Multiple shadowing gallstones are identified measuring up to 9 mm in
size. No evidence of gallbladder wall thickening or pericholecystic
fluid. Negative sonographic Murphy sign.

Common bile duct:

Diameter: 6 mm

Liver:

No focal lesion identified. Within normal limits in parenchymal
echogenicity. Portal vein is patent on color Doppler imaging with
normal direction of blood flow towards the liver.

Other: None.
IMPRESSION: 1. Cholelithiasis without acute cholecystitis.

## 2022-11-30 ENCOUNTER — Other Ambulatory Visit: Payer: Self-pay

## 2022-11-30 ENCOUNTER — Emergency Department: Payer: Self-pay

## 2022-11-30 ENCOUNTER — Emergency Department
Admission: EM | Admit: 2022-11-30 | Discharge: 2022-11-30 | Disposition: A | Discharge status: Home / Self Care | Payer: Self-pay | Attending: Emergency Medicine | Admitting: Emergency Medicine

## 2022-11-30 DIAGNOSIS — R030 Elevated blood-pressure reading, without diagnosis of hypertension: Secondary | ICD-10-CM | POA: Insufficient documentation

## 2022-11-30 DIAGNOSIS — R0789 Other chest pain: Secondary | ICD-10-CM | POA: Insufficient documentation

## 2022-11-30 DIAGNOSIS — M436 Torticollis: Secondary | ICD-10-CM | POA: Insufficient documentation

## 2022-11-30 LAB — BASIC METABOLIC PANEL
Anion gap: 7 (ref 5–15)
BUN: 6 mg/dL (ref 6–20)
CO2: 23 mmol/L (ref 22–32)
Calcium: 8.8 mg/dL — ABNORMAL LOW (ref 8.9–10.3)
Chloride: 106 mmol/L (ref 98–111)
Creatinine, Ser: 0.62 mg/dL (ref 0.44–1.00)
GFR, Estimated: >60 mL/min (ref >60)
Glucose, Bld: 170 mg/dL — ABNORMAL HIGH (ref 70–99)
Potassium: 3.4 mmol/L — ABNORMAL LOW (ref 3.5–5.1)
Sodium: 136 mmol/L (ref 135–145)

## 2022-11-30 LAB — CBC
HCT: 34 % — ABNORMAL LOW (ref 36.0–46.0)
Hemoglobin: 11 g/dL — ABNORMAL LOW (ref 12.0–15.0)
MCH: 27.5 pg (ref 26.0–34.0)
MCHC: 32.4 g/dL (ref 30.0–36.0)
MCV: 85 fL (ref 80.0–100.0)
Platelets: 311 10*3/uL (ref 150–400)
RBC: 4 MIL/uL (ref 3.87–5.11)
RDW: 15.4 % (ref 11.5–15.5)
WBC: 10.8 10*3/uL — ABNORMAL HIGH (ref 4.0–10.5)
nRBC: 0 % (ref 0.0–0.2)

## 2022-11-30 LAB — TROPONIN I (HIGH SENSITIVITY): Troponin I (High Sensitivity): 3 ng/L (ref <18)

## 2022-11-30 MED ORDER — DIAZEPAM 5 MG PO TABS: 5.0000 mg | ORAL_TABLET | Freq: Three times a day (TID) | ORAL | 0 refills | Status: AC | PRN

## 2022-11-30 MED ORDER — DIAZEPAM 5 MG PO TABS
5.0000 mg | ORAL_TABLET | Freq: Once | ORAL | Status: AC
  Administered 2022-11-30: 5 mg via ORAL
  Filled 2022-11-30: qty 1

## 2022-11-30 MED ORDER — LIDOCAINE 5 % EX PTCH
1.0000 | MEDICATED_PATCH | CUTANEOUS | Status: DC
  Administered 2022-11-30: 1 via TRANSDERMAL
  Filled 2022-11-30: qty 1

## 2022-11-30 MED ORDER — ACETAMINOPHEN 500 MG PO TABS
1000.0000 mg | ORAL_TABLET | Freq: Once | ORAL | Status: AC
  Administered 2022-11-30: 1000 mg via ORAL
  Filled 2022-11-30: qty 2

## 2022-11-30 MED ORDER — KETOROLAC TROMETHAMINE 30 MG/ML IJ SOLN
30.0000 mg | Freq: Once | INTRAMUSCULAR | Status: AC
  Administered 2022-11-30: 30 mg via INTRAMUSCULAR
  Filled 2022-11-30: qty 1

## 2022-11-30 NOTE — Discharge Instructions (Signed)
Tome paracetamol 650 mg e ibuprofeno 400 mg cada 6 horas para el dolor.  Tomar con la comida. Utilice los parches de lidoderm segn lo prescrito.  Tome Valium como relajante muscular; esto puede provocarle sueo.   Gracias por elegirnos para el cuidado de su salud hoy!  Consulte a su mdico de atencin primaria esta semana para una cita de seguimiento.   Si tiene algn sntoma nuevo, que empeora o inesperado, llame a su mdico de inmediato o regrese al departamento de emergencias para una reevaluacin.  Fue un placer cuidar de usted hoy.   Daneil Dan Modesto Charon, MD

## 2022-11-30 NOTE — ED Provider Notes (Signed)
[facility] Provider Note    Event Date/Time   First MD Initiated Contact with Patient 11/30/22 1659     (approximate)   History   Chest Pain   HPI  Brittany Richards is a 42 y.o. female   Past medical history of No significant past medical history presents emergency department with left-sided trapezius and neck pain starting yesterday.  No obvious inciting event.  Happened around 7:00 in the evening.  Tightness to the trapezius and left-sided paraspinal cervical area.  She had a transient episode of chest pain left-sided earlier today that was nonexertional but has no pain now.  She denies dizziness or visual changes.  She denies any other associated symptoms on review of systems she denies shortness of breath, abdominal pain, other GI symptoms or GU symptoms.  No fever.  Permission above was obtained via Spanish interpreter  Independent Historian contributed to assessment above: Her sister is at bedside corroborates information given above       Physical Exam   Triage Vital Signs: ED Triage Vitals  Enc Vitals Group     BP 11/30/22 1459 (!) 146/93     Pulse Rate 11/30/22 1459 (!) 108     Resp 11/30/22 1459 18     Temp 11/30/22 1459 98.3 F (36.8 C)     Temp Source 11/30/22 1459 Oral     SpO2 11/30/22 1502 100 %     Weight 11/30/22 1502 148 lb (67.1 kg)     Height --      Head Circumference --      Peak Flow --      Pain Score 11/30/22 1502 8     Pain Loc --      Pain Edu? --      Excl. in GC? --     Most recent vital signs: Vitals:   11/30/22 1502 11/30/22 1840  BP:    Pulse:  99  Resp:    Temp:    SpO2: 100%     General: Awake, no distress.  CV:  Good peripheral perfusion.  Resp:  Normal effort.  Abd:  No distention.  Other:  Awake alert comfortable appearing.  Can turn her head to the left side but has difficulty turning to the right due to tightness and pain.  She has tenderness to palpation of the left  trapezius muscle as well as the muscles on the left side of her neck.  No midline tenderness.  Neurovascular intact to both upper extremities, range both upper extremities with full active range of motion.  Clear lungs and nontender abdomen.     ED Results / Procedures / Treatments   Labs (all labs ordered are listed, but only abnormal results are displayed) Labs Reviewed  BASIC METABOLIC PANEL - Abnormal; Notable for the following components:      Result Value   Potassium 3.4 (*)    Glucose, Bld 170 (*)    Calcium 8.8 (*)    All other components within normal limits  CBC - Abnormal; Notable for the following components:   WBC 10.8 (*)    Hemoglobin 11.0 (*)    HCT 34.0 (*)    All other components within normal limits  POC URINE PREG, ED  TROPONIN I (HIGH SENSITIVITY)  TROPONIN I (HIGH SENSITIVITY)     I ordered and reviewed the above labs they are notable for electrolytes largely unremarkable, white blood cell count minimally elevated 10.8  EKG  ED ECG REPORT  Ross Marcus, the attending physician, personally viewed and interpreted this ECG.   Date: 11/30/2022  EKG Time: 1504  Rate: 112  Rhythm: sinus tachycardia  Axis: nl  Intervals:none  ST&T Change: no stemi    RADIOLOGY I independently reviewed and interpreted chest x-ray and I see no obvious focality pneumothorax   PROCEDURES:  Critical Care performed: No  Procedures   MEDICATIONS ORDERED IN ED: Medications  lidocaine (LIDODERM) 5 % 1 patch (1 patch Transdermal Patch Applied 11/30/22 1837)  ketorolac (TORADOL) 30 MG/ML injection 30 mg (30 mg Intramuscular Given 11/30/22 1836)  acetaminophen (TYLENOL) tablet 1,000 mg (1,000 mg Oral Given 11/30/22 1836)  diazepam (VALIUM) tablet 5 mg (5 mg Oral Given 11/30/22 1836)    IMPRESSION / MDM / ASSESSMENT AND PLAN / ED COURSE  I reviewed the triage vital signs and the nursing notes.                                Patient's presentation is most consistent with  acute presentation with potential threat to life or bodily function.  Differential diagnosis includes, but is not limited to, torticollis, muscle strain, muscle spasm, considered but less likely vertebral or carotid dissection, CVA, ACS   The patient is on the cardiac monitor to evaluate for evidence of arrhythmia and/or significant heart rate changes.  MDM:    Patient with symptoms consistent with an clinical exam consistent with torticollis and muscle strain.  She is tender to the left trapezius and left neck, range of motion limited to muscle tightness and pain.  Will treat with Toradol, give prescription for Lidoderm, anticipatory guidance.  I considered other more emergent pathologies like dissection, CVA, ACS but think less likely given benign exam as above but no associated neurologic symptoms.  Transient chest pain rules against ACS, nonischemic EKG and initial troponin negative with no ongoing chest pain low clinical suspicion for second troponin.  Discharge close PMD follow-up and return with any new or worsening symptoms.      FINAL CLINICAL IMPRESSION(S) / ED DIAGNOSES   Final diagnoses:  Torticollis     Rx / DC Orders   ED Discharge Orders          Ordered    diazepam (VALIUM) 5 MG tablet  Every 8 hours PRN        11/30/22 1821             Note:  This document was prepared using Dragon voice recognition software and may include unintentional dictation errors.]   Pilar Jarvis, MD 11/30/22 1919

## 2022-11-30 NOTE — ED Triage Notes (Signed)
Pt to ED for left sided cp radiating to left neck started yesterday. +nausea.

## 2023-06-01 ENCOUNTER — Emergency Department: Payer: Self-pay

## 2023-06-01 ENCOUNTER — Emergency Department
Admission: EM | Admit: 2023-06-01 | Discharge: 2023-06-02 | Disposition: A | Discharge status: Home / Self Care | Payer: Self-pay | Attending: Emergency Medicine | Admitting: Emergency Medicine

## 2023-06-01 ENCOUNTER — Other Ambulatory Visit: Payer: Self-pay

## 2023-06-01 DIAGNOSIS — N83201 Unspecified ovarian cyst, right side: Secondary | ICD-10-CM | POA: Insufficient documentation

## 2023-06-01 LAB — COMPREHENSIVE METABOLIC PANEL
ALT: 25 U/L (ref 0–44)
AST: 23 U/L (ref 15–41)
Albumin: 4.9 g/dL (ref 3.5–5.0)
Alkaline Phosphatase: 72 U/L (ref 38–126)
Anion gap: 12 (ref 5–15)
BUN: 10 mg/dL (ref 6–20)
CO2: 23 mmol/L (ref 22–32)
Calcium: 9 mg/dL (ref 8.9–10.3)
Chloride: 103 mmol/L (ref 98–111)
Creatinine, Ser: 0.56 mg/dL (ref 0.44–1.00)
GFR, Estimated: >60 mL/min (ref >60)
Glucose, Bld: 98 mg/dL (ref 70–99)
Potassium: 3.5 mmol/L (ref 3.5–5.1)
Sodium: 138 mmol/L (ref 135–145)
Total Bilirubin: 1.3 mg/dL — ABNORMAL HIGH (ref 0.0–1.2)
Total Protein: 8.4 g/dL — ABNORMAL HIGH (ref 6.5–8.1)

## 2023-06-01 LAB — CBC
HCT: 36.5 % (ref 36.0–46.0)
Hemoglobin: 12 g/dL (ref 12.0–15.0)
MCH: 28.6 pg (ref 26.0–34.0)
MCHC: 32.9 g/dL (ref 30.0–36.0)
MCV: 87.1 fL (ref 80.0–100.0)
Platelets: 383 10*3/uL (ref 150–400)
RBC: 4.19 MIL/uL (ref 3.87–5.11)
RDW: 14.3 % (ref 11.5–15.5)
WBC: 13.1 10*3/uL — ABNORMAL HIGH (ref 4.0–10.5)
nRBC: 0 % (ref 0.0–0.2)

## 2023-06-01 LAB — URINALYSIS, COMPLETE (UACMP) WITH MICROSCOPIC
Bilirubin Urine: NEGATIVE
Glucose, UA: NEGATIVE mg/dL
Hgb urine dipstick: NEGATIVE
Ketones, ur: 20 mg/dL — AB
Leukocytes,Ua: NEGATIVE
Nitrite: NEGATIVE
Protein, ur: NEGATIVE mg/dL
Specific Gravity, Urine: 1.021 (ref 1.005–1.030)
pH: 5 (ref 5.0–8.0)

## 2023-06-01 LAB — PREGNANCY, URINE: Preg Test, Ur: NEGATIVE

## 2023-06-01 MED ORDER — IBUPROFEN 600 MG PO TABS: 600.0000 mg | ORAL_TABLET | Freq: Four times a day (QID) | ORAL | 0 refills | Status: AC | PRN

## 2023-06-01 MED ORDER — ONDANSETRON 4 MG PO TBDP: 4.0000 mg | ORAL_TABLET | Freq: Three times a day (TID) | ORAL | 0 refills | Status: AC | PRN

## 2023-06-01 MED ORDER — ONDANSETRON HCL 4 MG/2ML IJ SOLN
4.0000 mg | Freq: Once | INTRAMUSCULAR | Status: AC
  Administered 2023-06-01: 4 mg via INTRAVENOUS
  Filled 2023-06-01: qty 2

## 2023-06-01 MED ORDER — HYDROCODONE-ACETAMINOPHEN 5-325 MG PO TABS: 1.0000 | ORAL_TABLET | ORAL | 0 refills | Status: AC | PRN

## 2023-06-01 MED ORDER — IOHEXOL 300 MG/ML  SOLN
100.0000 mL | Freq: Once | INTRAMUSCULAR | Status: AC | PRN
  Administered 2023-06-01: 100 mL via INTRAVENOUS

## 2023-06-01 MED ORDER — MORPHINE SULFATE (PF) 4 MG/ML IV SOLN
4.0000 mg | Freq: Once | INTRAVENOUS | Status: AC
  Administered 2023-06-01: 4 mg via INTRAVENOUS
  Filled 2023-06-01: qty 1

## 2023-06-01 NOTE — ED Triage Notes (Signed)
 Pt to ED for intermittent back pain x4 days. Denies n/v/d, urinary sx. Worsening pain with movement

## 2023-06-01 NOTE — Discharge Instructions (Addendum)
  Take acetaminophen 650 mg and ibuprofen 400 mg every 6 hours for pain.  Take with food.   Please take medications as prescribed as needed for pain.  If you develop any fevers increasing pain nausea or vomiting please return to the ER.

## 2023-06-01 NOTE — ED Notes (Signed)
 Iv started   meds given   ua to lab  family with pt

## 2023-06-01 NOTE — ED Provider Notes (Signed)
 [place] EM[facility]GENCY DEPARTMENT AT [facility] Provider Note   CSN: [id number] Arrival date & time: 06/01/23  1609     History  Chief Complaint  Patient presents with   Back Pain    Brittany Richards is a 44 y.o. female.  Presents to the emergency department for evaluation of right lower quadrant abdominal pain, right lower back pain.  Symptoms have been ongoing for 4 days.  She has had no nausea vomiting or fevers.  Pain is 9 out of 10.  She describes the pain as sharp and worse with movement.  No vaginal bleeding, discharge.  No hematuria.  HPI     Home Medications Prior to Admission medications   Medication Sig Start Date End Date Taking? Authorizing Provider  HYDROcodone -acetaminophen  (NORCO) 5-325 MG tablet Take 1 tablet by mouth every 4 (four) hours as needed for moderate pain (pain score 4-6). 06/01/23  Yes Charlene Debby BROCKS, PA-C  ibuprofen  (ADVIL ) 600 MG tablet Take 1 tablet (600 mg total) by mouth every 6 (six) hours as needed for moderate pain (pain score 4-6). 06/01/23  Yes Charlene Debby BROCKS, PA-C  ondansetron  (ZOFRAN -ODT) 4 MG disintegrating tablet Take 1 tablet (4 mg total) by mouth every 8 (eight) hours as needed for nausea or vomiting. 06/01/23  Yes Charlene Debby BROCKS, PA-C  ferrous sulfate 325 (65 FE) MG tablet Take 325 mg by mouth daily with breakfast.    [provider]  Omega-3 Fatty Acids (OMEGA 3 PO) Take 1 capsule by mouth daily.    [provider]      Allergies    Patient has no known allergies.    Review of Systems   Review of Systems  Physical Exam Updated Vital Signs BP (!) 169/90   Pulse 95   Temp 98.7 F (37.1 C)   Resp 16   Ht 5' 2 (1.575 m)   Wt 67 kg   SpO2 100%   BMI 27.02 kg/m  Physical Exam Constitutional:      Appearance: She is well-developed.  HENT:     Head: Normocephalic and atraumatic.  Eyes:     Conjunctiva/sclera: Conjunctivae normal.  Cardiovascular:     Rate and Rhythm: Normal rate.   Pulmonary:     Effort: Pulmonary effort is normal. No respiratory distress.     Breath sounds: Normal breath sounds. No stridor. No rhonchi.  Chest:     Chest wall: No tenderness.  Abdominal:     General: Bowel sounds are normal. There is no distension.     Tenderness: There is abdominal tenderness (RLQ). There is no right CVA tenderness, left CVA tenderness or guarding.  Musculoskeletal:        General: Normal range of motion.     Cervical back: Normal range of motion.  Skin:    General: Skin is warm.     Findings: No rash.  Neurological:     General: No focal deficit present.     Mental Status: She is alert and oriented to person, place, and time. Mental status is at baseline.  Psychiatric:        Behavior: Behavior normal.        Thought Content: Thought content normal.     ED Results / Procedures / Treatments   Labs (all labs ordered are listed, but only abnormal results are displayed) Labs Reviewed  URINALYSIS, COMPLETE (UACMP) WITH MICROSCOPIC - Abnormal; Notable for the following components:      Result Value   Color, Urine  YELLOW (*)    APPearance HAZY (*)    Ketones, ur 20 (*)    Bacteria, UA RARE (*)    All other components within normal limits  CBC - Abnormal; Notable for the following components:   WBC 13.1 (*)    All other components within normal limits  COMPREHENSIVE METABOLIC PANEL - Abnormal; Notable for the following components:   Total Protein 8.4 (*)    Total Bilirubin 1.3 (*)    All other components within normal limits  PREGNANCY, URINE    EKG None  Radiology CT ABDOMEN PELVIS W CONTRAST Result Date: 06/01/2023 CLINICAL DATA:  Right lower quadrant pain for several days, initial encounter EXAM: CT ABDOMEN AND PELVIS WITH CONTRAST TECHNIQUE: Multidetector CT imaging of the abdomen and pelvis was performed using the standard protocol following bolus administration of intravenous contrast. RADIATION DOSE REDUCTION: This exam was performed according  to the departmental dose-optimization program which includes automated exposure control, adjustment of the mA and/or kV according to patient size and/or use of iterative reconstruction technique. CONTRAST:  OMNIPAQUE  IOHEXOL  300 MG/ML  SOLN COMPARISON:  01/30/2021 FINDINGS: Lower chest: No acute abnormality. Hepatobiliary: No focal liver abnormality is seen. Status post cholecystectomy. No biliary dilatation. Pancreas: Unremarkable. No pancreatic ductal dilatation or surrounding inflammatory changes. Spleen: Normal in size without focal abnormality. Adrenals/Urinary Tract: Adrenal glands are within normal limits. Kidneys demonstrate a normal enhancement pattern bilaterally. No renal calculi or obstructive changes are seen. The bladder is partially distended. Stomach/Bowel: No obstructive or inflammatory changes of the colon are seen. The appendix is air-filled and within normal limits. Small bowel and stomach are unremarkable. Vascular/Lymphatic: No significant vascular findings are present. No enlarged abdominal or pelvic lymph nodes. Prominent left ovarian vein is noted stable from the prior exam. Reproductive: Uterus is within normal limits. 2 cm simple cyst is noted within the right ovary. The previously seen right hydrosalpinx has been removed in the interval. Other: No abdominal wall hernia or abnormality. No abdominopelvic ascites. Musculoskeletal: No acute or significant osseous findings. IMPRESSION: Normal-appearing appendix. 2 cm right adnexal simple cyst.  No follow-up is recommended. Postsurgical changes consistent with prior right salpingectomy. Electronically Signed   By: Oneil Devonshire M.D.   On: 06/01/2023 23:28    Procedures Procedures    Medications Ordered in ED Medications  morphine  (PF) 4 MG/ML injection 4 mg (4 mg Intravenous Given 06/01/23 2222)  ondansetron  (ZOFRAN ) injection 4 mg (4 mg Intravenous Given 06/01/23 2222)  iohexol  (OMNIPAQUE ) 300 MG/ML solution 100 mL (100 mLs  Intravenous Contrast Given 06/01/23 2302)    ED Course/ Medical Decision Making/ A&P                                 Medical Decision Making Amount and/or Complexity of Data Reviewed Labs: ordered. Radiology: ordered.  Risk Prescription drug management.   43 year old female with 4 days of right lower quadrant pain, CT scan shows no appendicitis, kidney stones.  CT scan did show a right ovarian cyst.  She has negative urinalysis showing no signs of blood or infection.  CBC and CMP within normal limits.  Vital signs are stable, afebrile nontachycardic.  Patient was given some pain medications and nausea medications to use at home as needed.  She is given strict return precautions.  She is return to the ER for any increasing pain, nausea or vomiting or fevers. Final Clinical Impression(s) / ED Diagnoses Final diagnoses:  Cyst of right ovary    Rx / DC Orders ED Discharge Orders          Ordered    HYDROcodone -acetaminophen  (NORCO) 5-325 MG tablet  Every 4 hours PRN        06/01/23 2342    ondansetron  (ZOFRAN -ODT) 4 MG disintegrating tablet  Every 8 hours PRN        06/01/23 2342    ibuprofen  (ADVIL ) 600 MG tablet  Every 6 hours PRN        06/01/23 2342              Charlene Debby BROCKS, PA-C 06/01/23 2345    Dorothyann Drivers, MD 06/06/23 2303

## 2023-06-01 NOTE — ED Provider Triage Note (Signed)
 Emergency Medicine Provider Triage Evaluation Note  [facility] , a 43 y.o. female  was evaluated in triage.  Pt complains of right back pain that increases with movement.  Denies fever nauseous dysuria or abdominal pain  Review of Systems  Positive:  Negative:   Physical Exam  BP (!) 169/90   Pulse 95   Temp 98.7 F (37.1 C)   Resp 16   Ht 5' 2 (1.575 m)   Wt 67 kg   SpO2 100%   BMI 27.02 kg/m  Gen:   Awake, no distress   Resp:  Normal effort  MSK:   Moves extremities without difficulty right lower back ender to palpation Other:    Medical Decision Making  Medically screening exam initiated at 4:27 PM.  Appropriate orders placed.  [facility] was informed that the remainder of the evaluation will be completed by another provider, this initial triage assessment does not replace that evaluation, and the importance of remaining in the ED until their evaluation is complete.     Janit Kast, PA-C 06/01/23 [phone]

## 2023-06-02 NOTE — ED Notes (Signed)
 Called patient and she did not answer. Her voice mailbox is not set up as well.
